# Patient Record
Sex: Female | Born: 1935 | Race: White | Hispanic: No | Smoking: Never smoker
Health system: Southern US, Community
[De-identification: ages and names within clinical notes are randomized; demographics above are authoritative.]

## PROBLEM LIST (undated history)

## (undated) DIAGNOSIS — N289 Disorder of kidney and ureter, unspecified: Secondary | ICD-10-CM

## (undated) DIAGNOSIS — E559 Vitamin D deficiency, unspecified: Secondary | ICD-10-CM

## (undated) DIAGNOSIS — E785 Hyperlipidemia, unspecified: Secondary | ICD-10-CM

## (undated) DIAGNOSIS — I10 Essential (primary) hypertension: Secondary | ICD-10-CM

## (undated) DIAGNOSIS — K219 Gastro-esophageal reflux disease without esophagitis: Secondary | ICD-10-CM

## (undated) DIAGNOSIS — H409 Unspecified glaucoma: Secondary | ICD-10-CM

## (undated) DIAGNOSIS — J45909 Unspecified asthma, uncomplicated: Secondary | ICD-10-CM

## (undated) HISTORY — DX: Gastro-esophageal reflux disease without esophagitis: K21.9

## (undated) HISTORY — PX: THYROID SURGERY: SHX805

## (undated) HISTORY — DX: Hyperlipidemia, unspecified: E78.5

## (undated) HISTORY — DX: Vitamin D deficiency, unspecified: E55.9

## (undated) HISTORY — PX: HIP SURGERY: SHX245

## (undated) HISTORY — DX: Disorder of kidney and ureter, unspecified: N28.9

## (undated) HISTORY — DX: Essential (primary) hypertension: I10

## (undated) HISTORY — PX: CATARACT EXTRACTION, BILATERAL: SHX1313

---

## 1995-12-09 HISTORY — PX: BREAST BIOPSY: SHX20

## 1999-09-30 ENCOUNTER — Encounter: Payer: Self-pay | Admitting: Urology

## 1999-09-30 ENCOUNTER — Encounter: Admission: RE | Admit: 1999-09-30 | Discharge: 1999-09-30 | Payer: Self-pay | Admitting: Urology

## 1999-10-16 ENCOUNTER — Encounter: Payer: Self-pay | Admitting: Urology

## 1999-10-16 ENCOUNTER — Ambulatory Visit (HOSPITAL_COMMUNITY): Admission: RE | Admit: 1999-10-16 | Discharge: 1999-10-16 | Payer: Self-pay | Admitting: Urology

## 2000-02-04 ENCOUNTER — Encounter: Payer: Self-pay | Admitting: Internal Medicine

## 2000-02-04 ENCOUNTER — Ambulatory Visit (HOSPITAL_COMMUNITY): Admission: RE | Admit: 2000-02-04 | Discharge: 2000-02-04 | Payer: Self-pay | Admitting: Internal Medicine

## 2000-06-15 ENCOUNTER — Ambulatory Visit (HOSPITAL_COMMUNITY): Admission: RE | Admit: 2000-06-15 | Discharge: 2000-06-15 | Payer: Self-pay | Admitting: Urology

## 2000-06-15 ENCOUNTER — Encounter: Payer: Self-pay | Admitting: Urology

## 2000-07-07 ENCOUNTER — Encounter: Payer: Self-pay | Admitting: Urology

## 2000-07-09 ENCOUNTER — Inpatient Hospital Stay (HOSPITAL_COMMUNITY): Admission: RE | Admit: 2000-07-09 | Discharge: 2000-07-12 | Payer: Self-pay | Admitting: Urology

## 2000-07-09 ENCOUNTER — Encounter (INDEPENDENT_AMBULATORY_CARE_PROVIDER_SITE_OTHER): Payer: Self-pay

## 2000-07-09 ENCOUNTER — Encounter: Payer: Self-pay | Admitting: Oral & Maxillofacial Surgery

## 2001-02-11 ENCOUNTER — Encounter: Payer: Self-pay | Admitting: Internal Medicine

## 2001-02-11 ENCOUNTER — Ambulatory Visit (HOSPITAL_COMMUNITY): Admission: RE | Admit: 2001-02-11 | Discharge: 2001-02-11 | Payer: Self-pay | Admitting: Internal Medicine

## 2001-04-29 ENCOUNTER — Encounter: Payer: Self-pay | Admitting: Urology

## 2001-04-29 ENCOUNTER — Ambulatory Visit (HOSPITAL_COMMUNITY): Admission: RE | Admit: 2001-04-29 | Discharge: 2001-04-29 | Payer: Self-pay | Admitting: Urology

## 2001-05-05 ENCOUNTER — Encounter: Payer: Self-pay | Admitting: Urology

## 2001-05-05 ENCOUNTER — Ambulatory Visit (HOSPITAL_COMMUNITY): Admission: RE | Admit: 2001-05-05 | Discharge: 2001-05-05 | Payer: Self-pay | Admitting: Urology

## 2001-05-19 ENCOUNTER — Encounter: Payer: Self-pay | Admitting: Urology

## 2001-05-20 ENCOUNTER — Ambulatory Visit (HOSPITAL_COMMUNITY): Admission: RE | Admit: 2001-05-20 | Discharge: 2001-05-20 | Payer: Self-pay | Admitting: Urology

## 2001-05-27 ENCOUNTER — Encounter: Payer: Self-pay | Admitting: Urology

## 2001-05-27 ENCOUNTER — Ambulatory Visit (HOSPITAL_COMMUNITY): Admission: RE | Admit: 2001-05-27 | Discharge: 2001-05-27 | Payer: Self-pay | Admitting: Urology

## 2001-06-11 ENCOUNTER — Ambulatory Visit (HOSPITAL_COMMUNITY): Admission: RE | Admit: 2001-06-11 | Discharge: 2001-06-11 | Payer: Self-pay | Admitting: Urology

## 2001-07-22 ENCOUNTER — Encounter: Admission: RE | Admit: 2001-07-22 | Discharge: 2001-07-22 | Payer: Self-pay | Admitting: Urology

## 2001-07-22 ENCOUNTER — Encounter: Payer: Self-pay | Admitting: Urology

## 2001-07-27 ENCOUNTER — Encounter: Payer: Self-pay | Admitting: Internal Medicine

## 2001-07-27 ENCOUNTER — Ambulatory Visit (HOSPITAL_COMMUNITY): Admission: RE | Admit: 2001-07-27 | Discharge: 2001-07-27 | Payer: Self-pay | Admitting: Internal Medicine

## 2001-10-13 ENCOUNTER — Ambulatory Visit (HOSPITAL_COMMUNITY): Admission: RE | Admit: 2001-10-13 | Discharge: 2001-10-13 | Payer: Self-pay | Admitting: Urology

## 2001-10-13 ENCOUNTER — Encounter: Payer: Self-pay | Admitting: Urology

## 2001-10-22 ENCOUNTER — Ambulatory Visit (HOSPITAL_BASED_OUTPATIENT_CLINIC_OR_DEPARTMENT_OTHER): Admission: RE | Admit: 2001-10-22 | Discharge: 2001-10-22 | Payer: Self-pay | Admitting: Urology

## 2001-12-04 ENCOUNTER — Inpatient Hospital Stay (HOSPITAL_COMMUNITY): Admission: EM | Admit: 2001-12-04 | Discharge: 2001-12-16 | Payer: Self-pay | Admitting: Emergency Medicine

## 2001-12-04 ENCOUNTER — Encounter (INDEPENDENT_AMBULATORY_CARE_PROVIDER_SITE_OTHER): Payer: Self-pay | Admitting: Specialist

## 2001-12-07 ENCOUNTER — Encounter: Payer: Self-pay | Admitting: Urology

## 2001-12-08 HISTORY — PX: COLONOSCOPY: SHX174

## 2001-12-08 HISTORY — PX: NEPHRECTOMY: SHX65

## 2001-12-12 ENCOUNTER — Encounter: Payer: Self-pay | Admitting: Urology

## 2002-07-26 ENCOUNTER — Encounter: Payer: Self-pay | Admitting: Internal Medicine

## 2002-07-26 ENCOUNTER — Ambulatory Visit (HOSPITAL_COMMUNITY): Admission: RE | Admit: 2002-07-26 | Discharge: 2002-07-26 | Payer: Self-pay | Admitting: Internal Medicine

## 2002-07-28 ENCOUNTER — Encounter: Admission: RE | Admit: 2002-07-28 | Discharge: 2002-07-28 | Payer: Self-pay | Admitting: Internal Medicine

## 2002-07-28 ENCOUNTER — Encounter: Payer: Self-pay | Admitting: Internal Medicine

## 2002-10-27 ENCOUNTER — Ambulatory Visit (HOSPITAL_COMMUNITY): Admission: RE | Admit: 2002-10-27 | Discharge: 2002-10-27 | Payer: Self-pay | Admitting: Gastroenterology

## 2003-07-02 ENCOUNTER — Emergency Department (HOSPITAL_COMMUNITY): Admission: EM | Admit: 2003-07-02 | Discharge: 2003-07-02 | Payer: Self-pay | Admitting: Emergency Medicine

## 2003-07-02 ENCOUNTER — Encounter: Payer: Self-pay | Admitting: Emergency Medicine

## 2003-08-24 ENCOUNTER — Ambulatory Visit (HOSPITAL_COMMUNITY): Admission: RE | Admit: 2003-08-24 | Discharge: 2003-08-24 | Payer: Self-pay | Admitting: Internal Medicine

## 2003-08-24 ENCOUNTER — Encounter: Payer: Self-pay | Admitting: Internal Medicine

## 2004-03-22 ENCOUNTER — Encounter: Admission: RE | Admit: 2004-03-22 | Discharge: 2004-03-22 | Payer: Self-pay | Admitting: Internal Medicine

## 2005-10-21 ENCOUNTER — Encounter: Admission: RE | Admit: 2005-10-21 | Discharge: 2005-10-21 | Payer: Self-pay | Admitting: Internal Medicine

## 2005-12-04 ENCOUNTER — Ambulatory Visit (HOSPITAL_COMMUNITY): Admission: RE | Admit: 2005-12-04 | Discharge: 2005-12-04 | Payer: Self-pay | Admitting: Internal Medicine

## 2006-07-30 ENCOUNTER — Encounter: Admission: RE | Admit: 2006-07-30 | Discharge: 2006-07-30 | Payer: Self-pay | Admitting: Internal Medicine

## 2006-10-23 ENCOUNTER — Encounter: Admission: RE | Admit: 2006-10-23 | Discharge: 2006-10-23 | Payer: Self-pay | Admitting: Internal Medicine

## 2007-05-13 ENCOUNTER — Encounter: Admission: RE | Admit: 2007-05-13 | Discharge: 2007-05-13 | Payer: Self-pay | Admitting: Gastroenterology

## 2007-05-25 ENCOUNTER — Ambulatory Visit (HOSPITAL_COMMUNITY): Admission: RE | Admit: 2007-05-25 | Discharge: 2007-05-25 | Payer: Self-pay | Admitting: Gastroenterology

## 2007-11-25 ENCOUNTER — Encounter: Admission: RE | Admit: 2007-11-25 | Discharge: 2007-11-25 | Payer: Self-pay | Admitting: Orthopedic Surgery

## 2007-12-09 HISTORY — PX: ESOPHAGOGASTRODUODENOSCOPY: SHX1529

## 2007-12-16 ENCOUNTER — Encounter: Admission: RE | Admit: 2007-12-16 | Discharge: 2007-12-16 | Payer: Self-pay | Admitting: Orthopedic Surgery

## 2008-02-10 ENCOUNTER — Ambulatory Visit (HOSPITAL_COMMUNITY): Admission: RE | Admit: 2008-02-10 | Discharge: 2008-02-10 | Payer: Self-pay

## 2008-03-31 ENCOUNTER — Encounter: Admission: RE | Admit: 2008-03-31 | Discharge: 2008-03-31 | Payer: Self-pay | Admitting: Internal Medicine

## 2008-10-17 ENCOUNTER — Ambulatory Visit (HOSPITAL_COMMUNITY): Admission: RE | Admit: 2008-10-17 | Discharge: 2008-10-17 | Payer: Self-pay | Admitting: Gastroenterology

## 2009-10-02 ENCOUNTER — Emergency Department (HOSPITAL_COMMUNITY): Admission: EM | Admit: 2009-10-02 | Discharge: 2009-10-02 | Payer: Self-pay | Admitting: Emergency Medicine

## 2010-02-01 ENCOUNTER — Emergency Department (HOSPITAL_COMMUNITY): Admission: EM | Admit: 2010-02-01 | Discharge: 2010-02-01 | Payer: Self-pay | Admitting: Emergency Medicine

## 2010-03-26 ENCOUNTER — Ambulatory Visit (HOSPITAL_COMMUNITY): Admission: RE | Admit: 2010-03-26 | Discharge: 2010-03-26 | Payer: Self-pay | Admitting: Internal Medicine

## 2010-04-02 ENCOUNTER — Encounter: Admission: RE | Admit: 2010-04-02 | Discharge: 2010-04-02 | Payer: Self-pay | Admitting: Internal Medicine

## 2010-07-10 ENCOUNTER — Encounter: Admission: RE | Admit: 2010-07-10 | Discharge: 2010-07-10 | Payer: Self-pay | Admitting: Orthopedic Surgery

## 2010-11-29 ENCOUNTER — Emergency Department (HOSPITAL_COMMUNITY)
Admission: EM | Admit: 2010-11-29 | Discharge: 2010-11-29 | Payer: Self-pay | Source: Home / Self Care | Admitting: Emergency Medicine

## 2010-12-29 ENCOUNTER — Encounter: Payer: Self-pay | Admitting: Internal Medicine

## 2011-02-17 LAB — URINALYSIS, ROUTINE W REFLEX MICROSCOPIC
Bilirubin Urine: NEGATIVE
Glucose, UA: NEGATIVE mg/dL
Leukocytes, UA: NEGATIVE
Nitrite: NEGATIVE
Protein, ur: NEGATIVE mg/dL
Specific Gravity, Urine: 1.005 (ref 1.005–1.030)
Urobilinogen, UA: 0.2 mg/dL (ref 0.0–1.0)

## 2011-02-17 LAB — CBC
HCT: 38.2 % (ref 36.0–46.0)
MCHC: 32.2 g/dL (ref 30.0–36.0)
MCV: 93.2 fL (ref 78.0–100.0)
RBC: 4.1 MIL/uL (ref 3.87–5.11)

## 2011-02-17 LAB — DIFFERENTIAL
Basophils Relative: 1 % (ref 0–1)
Lymphocytes Relative: 37 % (ref 12–46)
Monocytes Absolute: 0.6 10*3/uL (ref 0.1–1.0)
Monocytes Relative: 9 % (ref 3–12)
Neutro Abs: 3.2 10*3/uL (ref 1.7–7.7)

## 2011-02-17 LAB — BASIC METABOLIC PANEL
CO2: 25 mEq/L (ref 19–32)
Calcium: 9.5 mg/dL (ref 8.4–10.5)
GFR calc Af Amer: 37 mL/min — ABNORMAL LOW (ref >60)
GFR calc non Af Amer: 30 mL/min — ABNORMAL LOW (ref >60)
Sodium: 141 mEq/L (ref 135–145)

## 2011-02-17 LAB — URINE MICROSCOPIC-ADD ON

## 2011-02-17 LAB — POCT CARDIAC MARKERS: Troponin i, poc: <0.05 ng/mL (ref 0.00–0.09)

## 2011-02-26 LAB — DIFFERENTIAL
Basophils Relative: 1 % (ref 0–1)
Lymphocytes Relative: 13 % (ref 12–46)
Lymphs Abs: 1.3 10*3/uL (ref 0.7–4.0)
Monocytes Relative: 3 % (ref 3–12)
Neutrophils Relative %: 83 % — ABNORMAL HIGH (ref 43–77)

## 2011-02-26 LAB — POCT CARDIAC MARKERS
CKMB, poc: <1 ng/mL — ABNORMAL LOW (ref 1.0–8.0)
Myoglobin, poc: 98.5 ng/mL (ref 12–200)
Troponin i, poc: <0.05 ng/mL (ref 0.00–0.09)
Troponin i, poc: <0.05 ng/mL (ref 0.00–0.09)

## 2011-02-26 LAB — CBC
Hemoglobin: 12 g/dL (ref 12.0–15.0)
Platelets: 187 10*3/uL (ref 150–400)
RBC: 3.87 MIL/uL (ref 3.87–5.11)
WBC: 10.5 10*3/uL (ref 4.0–10.5)

## 2011-02-26 LAB — POCT I-STAT, CHEM 8
BUN: 34 mg/dL — ABNORMAL HIGH (ref 6–23)
Chloride: 107 mEq/L (ref 96–112)
HCT: 36 % (ref 36.0–46.0)
Hemoglobin: 12.2 g/dL (ref 12.0–15.0)
Potassium: 4.3 mEq/L (ref 3.5–5.1)

## 2011-03-03 ENCOUNTER — Other Ambulatory Visit: Payer: Self-pay | Admitting: Internal Medicine

## 2011-03-03 DIAGNOSIS — Z1231 Encounter for screening mammogram for malignant neoplasm of breast: Secondary | ICD-10-CM

## 2011-03-07 ENCOUNTER — Ambulatory Visit
Admission: RE | Admit: 2011-03-07 | Discharge: 2011-03-07 | Disposition: A | Discharge status: Home / Self Care | Payer: Medicaid Other | Source: Ambulatory Visit | Attending: Internal Medicine | Admitting: Internal Medicine

## 2011-03-07 DIAGNOSIS — Z1231 Encounter for screening mammogram for malignant neoplasm of breast: Secondary | ICD-10-CM

## 2011-04-12 ENCOUNTER — Emergency Department (HOSPITAL_COMMUNITY): Payer: Medicare Other

## 2011-04-12 ENCOUNTER — Inpatient Hospital Stay (HOSPITAL_COMMUNITY)
Admission: EM | Admit: 2011-04-12 | Discharge: 2011-04-17 | DRG: 481 | Disposition: A | Discharge status: Skilled Nursing Facility | Payer: Medicare Other | Attending: Orthopedic Surgery | Admitting: Orthopedic Surgery

## 2011-04-12 DIAGNOSIS — K219 Gastro-esophageal reflux disease without esophagitis: Secondary | ICD-10-CM | POA: Diagnosis present

## 2011-04-12 DIAGNOSIS — Z905 Acquired absence of kidney: Secondary | ICD-10-CM

## 2011-04-12 DIAGNOSIS — E890 Postprocedural hypothyroidism: Secondary | ICD-10-CM | POA: Diagnosis present

## 2011-04-12 DIAGNOSIS — N179 Acute kidney failure, unspecified: Secondary | ICD-10-CM | POA: Diagnosis not present

## 2011-04-12 DIAGNOSIS — D72829 Elevated white blood cell count, unspecified: Secondary | ICD-10-CM | POA: Diagnosis present

## 2011-04-12 DIAGNOSIS — F411 Generalized anxiety disorder: Secondary | ICD-10-CM | POA: Diagnosis present

## 2011-04-12 DIAGNOSIS — Y92009 Unspecified place in unspecified non-institutional (private) residence as the place of occurrence of the external cause: Secondary | ICD-10-CM

## 2011-04-12 DIAGNOSIS — D62 Acute posthemorrhagic anemia: Secondary | ICD-10-CM | POA: Diagnosis not present

## 2011-04-12 DIAGNOSIS — J449 Chronic obstructive pulmonary disease, unspecified: Secondary | ICD-10-CM | POA: Diagnosis present

## 2011-04-12 DIAGNOSIS — J9819 Other pulmonary collapse: Secondary | ICD-10-CM | POA: Diagnosis not present

## 2011-04-12 DIAGNOSIS — M549 Dorsalgia, unspecified: Secondary | ICD-10-CM | POA: Diagnosis present

## 2011-04-12 DIAGNOSIS — W010XXA Fall on same level from slipping, tripping and stumbling without subsequent striking against object, initial encounter: Secondary | ICD-10-CM | POA: Diagnosis present

## 2011-04-12 DIAGNOSIS — J4489 Other specified chronic obstructive pulmonary disease: Secondary | ICD-10-CM | POA: Diagnosis present

## 2011-04-12 DIAGNOSIS — G8929 Other chronic pain: Secondary | ICD-10-CM | POA: Diagnosis present

## 2011-04-12 DIAGNOSIS — S72033A Displaced midcervical fracture of unspecified femur, initial encounter for closed fracture: Principal | ICD-10-CM | POA: Diagnosis present

## 2011-04-12 DIAGNOSIS — I10 Essential (primary) hypertension: Secondary | ICD-10-CM | POA: Diagnosis present

## 2011-04-12 LAB — BASIC METABOLIC PANEL
CO2: 25 mEq/L (ref 19–32)
Chloride: 96 mEq/L (ref 96–112)
GFR calc Af Amer: 34 mL/min — ABNORMAL LOW (ref >60)
Potassium: 3.6 mEq/L (ref 3.5–5.1)
Sodium: 131 mEq/L — ABNORMAL LOW (ref 135–145)

## 2011-04-12 LAB — DIFFERENTIAL
Basophils Absolute: 0 10*3/uL (ref 0.0–0.1)
Basophils Relative: 0 % (ref 0–1)
Lymphocytes Relative: 21 % (ref 12–46)
Lymphs Abs: 1.8 10*3/uL (ref 0.7–4.0)

## 2011-04-12 LAB — CBC
Hemoglobin: 11.7 g/dL — ABNORMAL LOW (ref 12.0–15.0)
RBC: 3.91 MIL/uL (ref 3.87–5.11)

## 2011-04-13 LAB — CBC
HCT: 33.5 % — ABNORMAL LOW (ref 36.0–46.0)
Hemoglobin: 11.1 g/dL — ABNORMAL LOW (ref 12.0–15.0)
MCH: 29.3 pg (ref 26.0–34.0)
MCV: 88.4 fL (ref 78.0–100.0)
RBC: 3.79 MIL/uL — ABNORMAL LOW (ref 3.87–5.11)

## 2011-04-13 LAB — COMPREHENSIVE METABOLIC PANEL
AST: 79 U/L — ABNORMAL HIGH (ref 0–37)
BUN: 20 mg/dL (ref 6–23)
CO2: 25 mEq/L (ref 19–32)
Chloride: 105 mEq/L (ref 96–112)
Creatinine, Ser: 1.53 mg/dL — ABNORMAL HIGH (ref 0.4–1.2)
GFR calc Af Amer: 38 mL/min — ABNORMAL LOW (ref >60)
GFR calc non Af Amer: 32 mL/min — ABNORMAL LOW (ref >60)
Total Bilirubin: 0.7 mg/dL (ref 0.3–1.2)

## 2011-04-13 NOTE — Consult Note (Signed)
Kelly Li, Kelly Li                 ACCOUNT NO.:  [id number]  MEDICAL RECORD NO.:  [id number]           PATIENT TYPE:  I  LOCATION:  1612                         FACILITY:  [facility]  PHYSICIAN:  Lonia Blood, M.D.      DATE OF BIRTH:  1917-01-29  DATE OF CONSULTATION:  04/13/2011 DATE OF DISCHARGE:                                CONSULTATION   REQUESTING PHYSICIAN:  Ollen Gross, M.D., Orthopedics  REASON FOR CONSULTATION:  Medical management of asthma, hypothyroidism, and preoperative clearance.  BRIEF HISTORY OF PRESENT ILLNESS:  The patient is a 77 year old female who has been admitted to the hospital secondary to mechanical fall resulting in right intertrochanteric hip fracture.  She was apparently remarkably doing well.  She was driving around, walking without support, and was actually picking flowers today in her yard without any major problem.  She has no prior fracture except for her toe for which she had seen Dr. Alveda Reasons.  The patient was turning when she slipped, lost her balance and fell, and she sustained a right intertrochanteric fracture, came to the ED.  She is currently stable after pain control.  She is awake, alert, oriented without any other injuries.  The patient is being evaluated for possible surgical repair of her fractures.  PAST MEDICAL HISTORY:  Significant for seasonal allergies, asthma, chronic back pain, GERD, hypothyroidism, anxiety disorder, and occasional migraine headaches.  ALLERGIES: 1. CODEINE. 2. MORPHINE.  CURRENT MEDICATIONS:  Advair Diskus, Claritin, Fioricet, levothyroxine, lisinopril, Prevacid, temazepam, Vicodin, and Xanax.  SOCIAL HISTORY:  The patient lives alone at home in [place].  She has 2 grown-up children who are around and available to assist her.  No tobacco, alcohol, or IV drug use.  She is very much able to do all her ADLs without much support.  FAMILY HISTORY:  Noncontributory.  REVIEW OF SYSTEMS:  All systems  reviewed are negative except per HPI.  PHYSICAL EXAMINATION:  VITAL SIGNS:  Temperature 99.9, blood pressure 164/57, pulse 63, respiratory rate 15, sats 96% on room air. GENERAL:  She is a very pleasant 77 year old female, awake, alert, oriented, communicating.  She is in no acute distress. HEENT:  PERRL.  EOMI.  No pallor noted.  There is no rhinorrhea. NECK:  Supple.  No JVD, no lymphadenopathy. RESPIRATORY:  She has good air entry bilaterally.  No wheezes, no rales. CARDIOVASCULAR SYSTEM:  She has S1, S2.  No murmur. ABDOMEN:  Soft, full, nontender with positive bowel sounds. EXTREMITIES:  Right lower extremity is shortened and laterally rotated. It is tender at the lateral aspect of her hip and angulated.  No ecchymosis seen.  No other joints involving the same. SKIN EXAM:  No rashes, no bruises, no ulcers. NEURO EXAM:  Cranial nerves II through XII seem to be intact.  No focal neurologic deficit.  LABORATORY DATA:  White count 8.9, hemoglobin 11.7 with MCV of 87.  Her platelet count is 204.  Sodium 131, potassium 3.6, chloride 96, CO2 of 25, glucose 122, BUN is 25, creatinine 1.69, calcium 9.0.  Right ankle x- ray showed no acute findings with some osteopenia.  Right  hip 2-view showed impacted subcapital right femoral neck fracture and some osteopenia.  Chest x-ray shows no active disease.  CT head without contrast showed no evidence of intracranial hemorrhage and no major problems observed.  There is mild cerebral atrophy and chronic small vessel ischemic changes.  ASSESSMENT:  This is a 77 year old female with the following problems: 1. Right femoral neck fracture. 2. Mild normocytic anemia. 3. Hyponatremia. 4. Probably acute renal insufficiency. 5. Asthma. 6. Hypothyroidism. 7. Gastroesophageal reflux disease. 8. Chronic back pain. 9. Seasonal allergies.  RECOMMENDATIONS: 1. The patient seems to be stable and from medical point of view     cleared for surgical  reduction.  She is pretty active with     activities of daily living and has full mental capacity, so she     should undergo surgical reduction at this point. 2. Continue with home medications. 3. We will check EKG, but she has no cardiac disease, at least to the     best of our knowledge.  She will not necessarily need an echo at     this point. 4. We will follow her CBC and electrolytes while in the hospital. 5. DVT prophylaxis is required starting with some Lovenox pre and     postoperatively.  Thank you for the consult, and we will follow with you.     Lonia Blood, M.D.     Kelly Li  D:  04/13/2011  T:  04/13/2011  Job:  [id number]  Electronically Signed by Lonia Blood M.D. on 04/13/2011 10:12:31 PM

## 2011-04-14 ENCOUNTER — Inpatient Hospital Stay (HOSPITAL_COMMUNITY): Payer: Medicare Other

## 2011-04-14 LAB — CBC
MCH: 29.8 pg (ref 26.0–34.0)
Platelets: 146 10*3/uL — ABNORMAL LOW (ref 150–400)
RBC: 3.79 MIL/uL — ABNORMAL LOW (ref 3.87–5.11)

## 2011-04-14 LAB — BASIC METABOLIC PANEL
Calcium: 8.3 mg/dL — ABNORMAL LOW (ref 8.4–10.5)
Chloride: 103 mEq/L (ref 96–112)
Creatinine, Ser: 1.22 mg/dL — ABNORMAL HIGH (ref 0.4–1.2)
GFR calc Af Amer: 50 mL/min — ABNORMAL LOW (ref >60)
GFR calc non Af Amer: 41 mL/min — ABNORMAL LOW (ref >60)

## 2011-04-14 IMAGING — CT CT PELVIS W/O CM
2 of 3 series · 17 of 46 positions shown, 19 images · non-contrast
Comparison: CT abdomen pelvis 10/02/2009

CLINICAL DATA: Low back pain.  Right buttock and flank pain.

CT PELVIS WITHOUT CONTRAST
TECHNIQUE: Multidetector CT imaging of the pelvis was performed
following the standard protocol without intravenous contrast.

[Series 3: pelvis standard · axial · 0.66mm/px · z∈[-202,-2]mm · 14 of 92 slices shown, 16 images]
[im 6/92  soft-tissue]
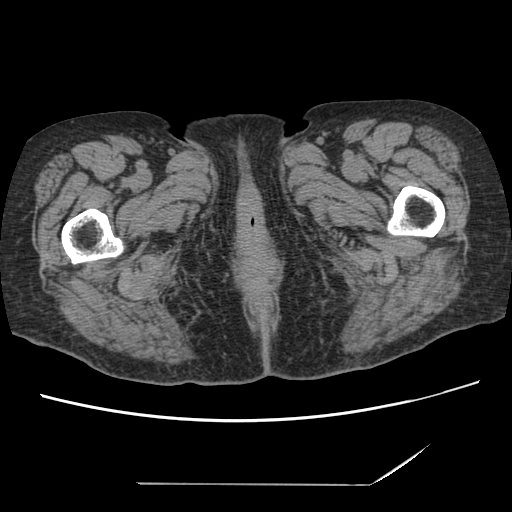
[im 6/92  bone]
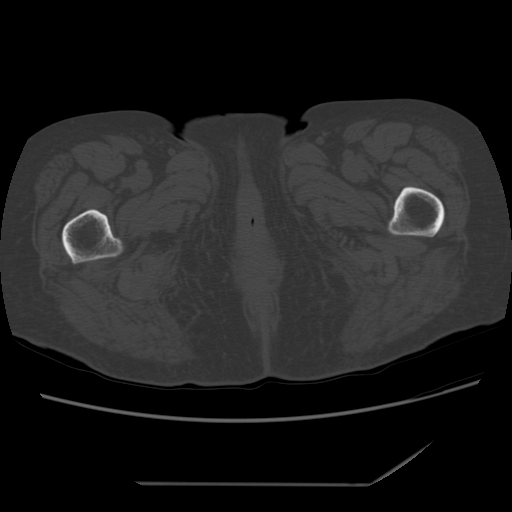
[im 12/92  soft-tissue]
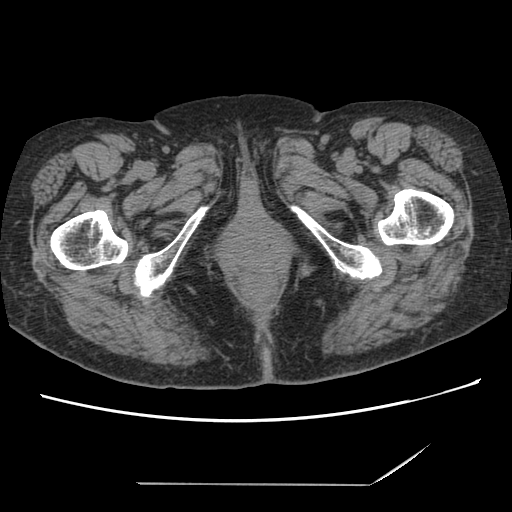
[im 18/92  soft-tissue]
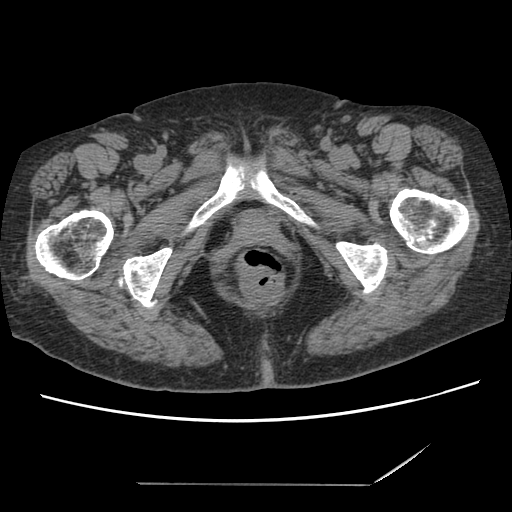
[im 24/92  soft-tissue]
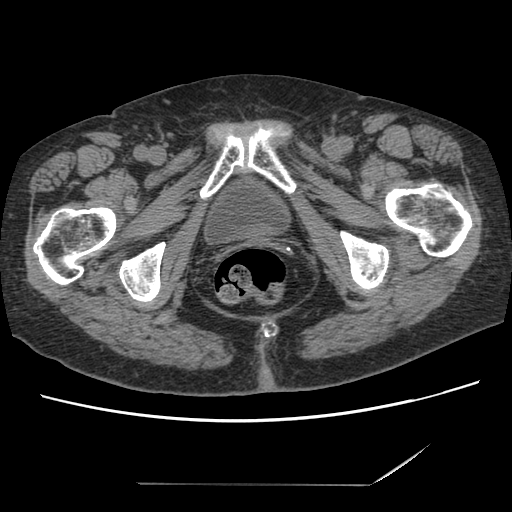
[im 30/92  soft-tissue]
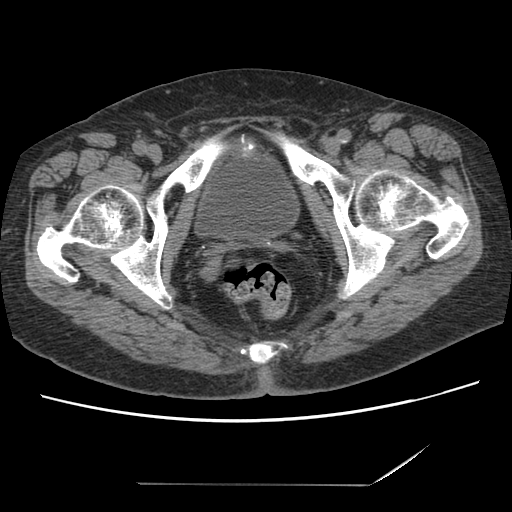
[im 36/92  soft-tissue]
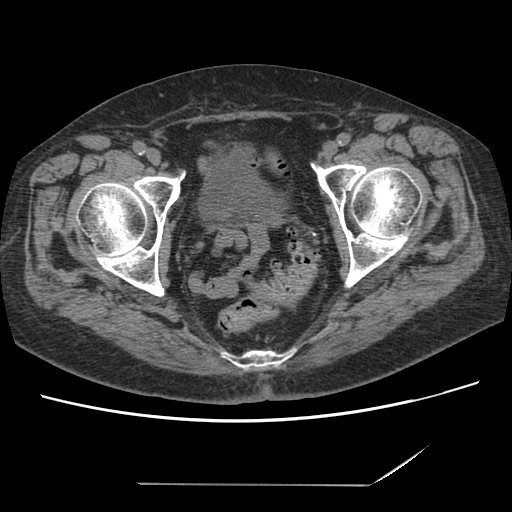
[im 42/92  soft-tissue]
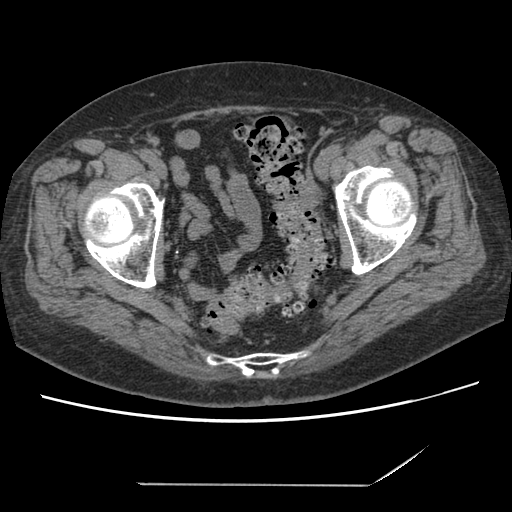
[im 50/92  soft-tissue]
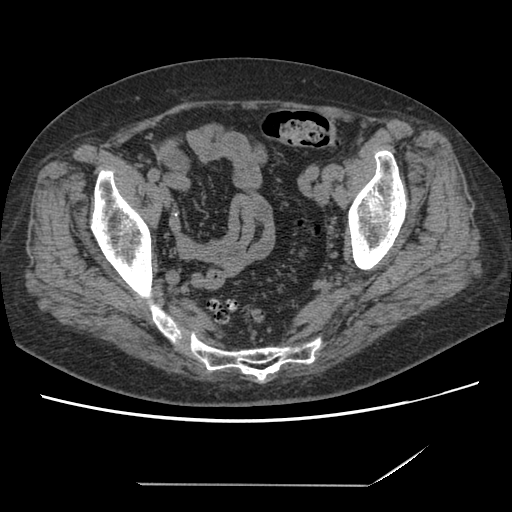
[im 56/92  soft-tissue]
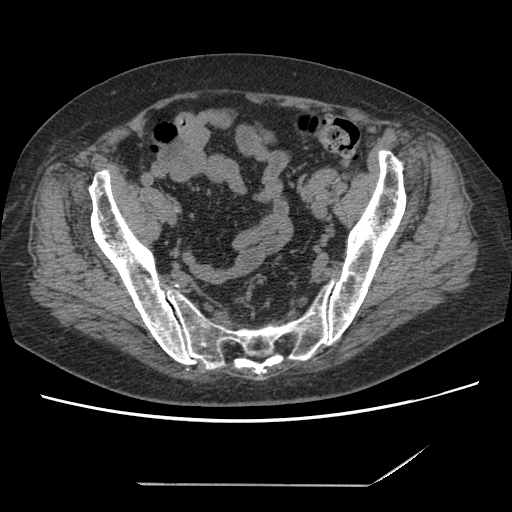
[im 56/92  bone]
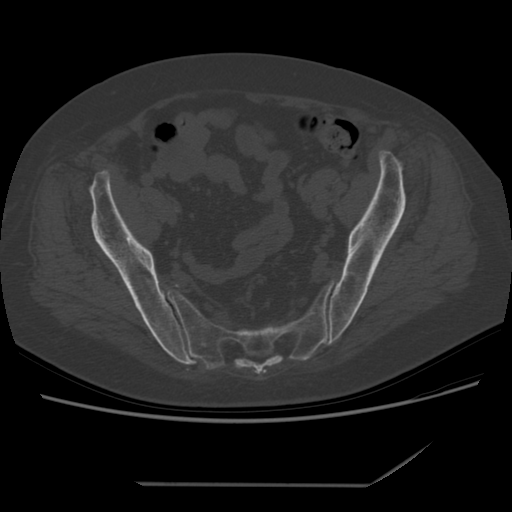
[im 62/92  soft-tissue]
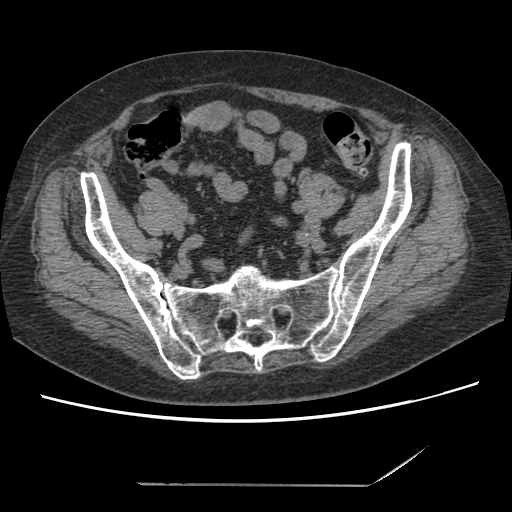
[im 68/92  soft-tissue]
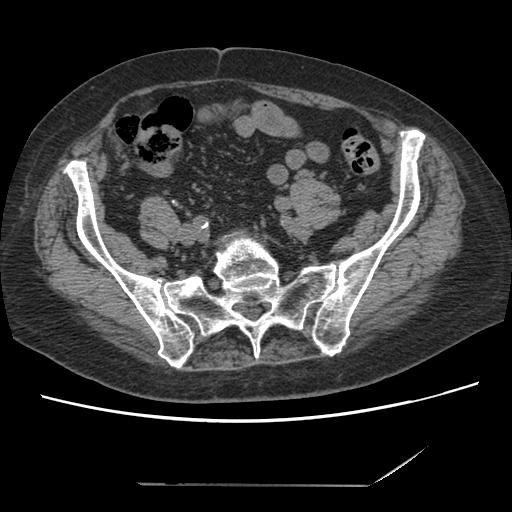
[im 74/92  soft-tissue]
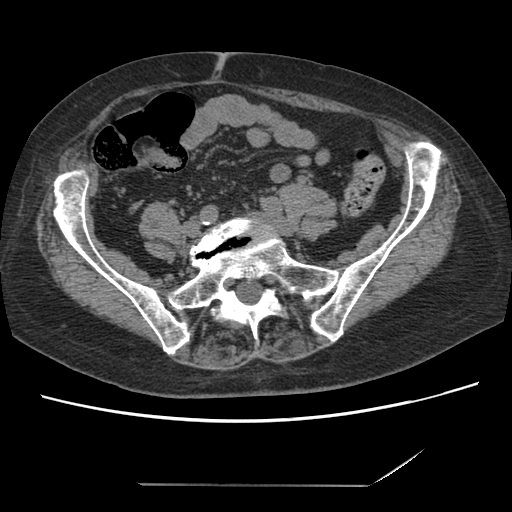
[im 80/92  soft-tissue]
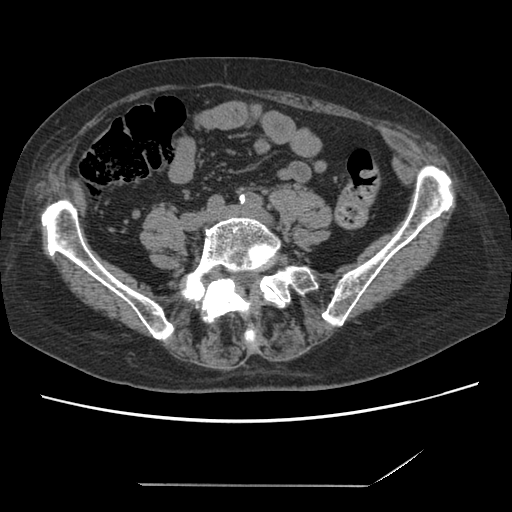
[im 86/92  soft-tissue]
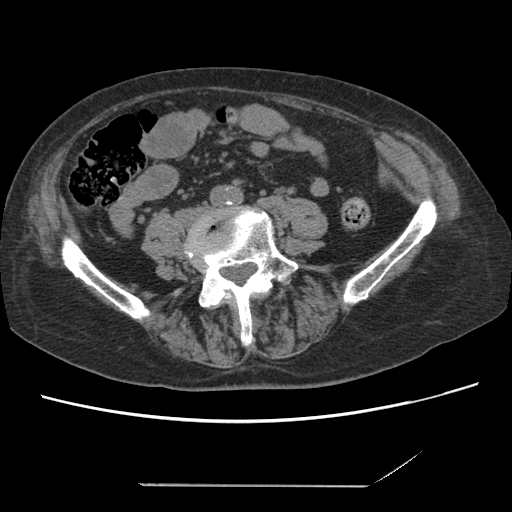

[Series 400: cor · coronal · 0.66mm/px · 3 of 103 slices shown]
[im 35/103  soft-tissue]
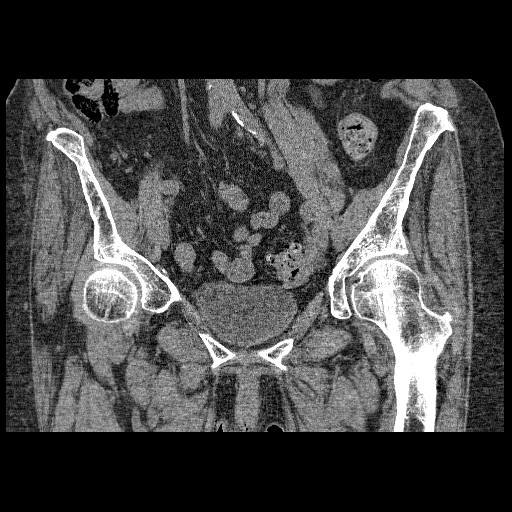
[im 46/103  soft-tissue]
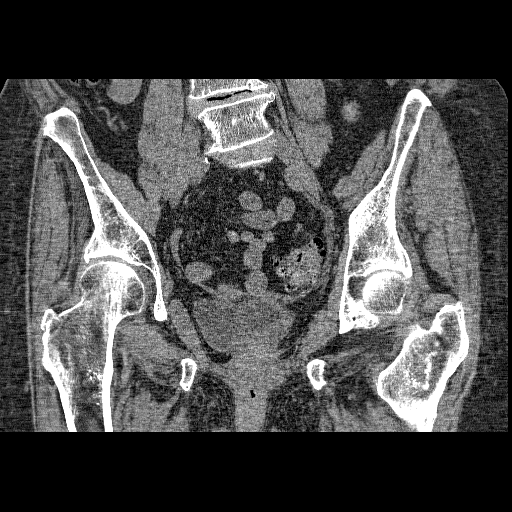
[im 57/103  soft-tissue]
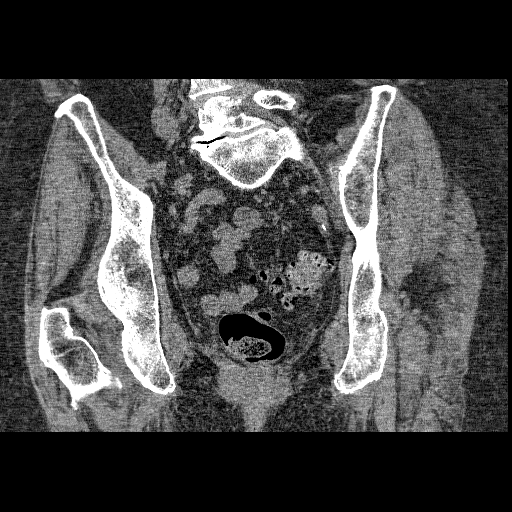

[17 of 46 positions shown; findings below may reference images not displayed]

FINDINGS: Osteopenia.  Advanced facet hypertrophy is seen on the
right at L5-S1.  There is right lateral loss of disc space height
at this level, with vacuum disc phenomenon.  Vacuum disc phenomenon
is also seen at L4-5.  No acute fracture.  Hip joint space is
maintained bilaterally.

There may be fat in the the dome of the bladder, which can be a
normal finding.  Extensive colonic diverticulosis.  Hysterectomy.
Ovaries are visualized. No pathologically enlarged lymph nodes.  No
free fluid.
IMPRESSION: Spondylosis, with asymmetric involvement on the right at L5-S1.

## 2011-04-15 ENCOUNTER — Inpatient Hospital Stay (HOSPITAL_COMMUNITY): Payer: Medicare Other

## 2011-04-15 LAB — CBC
MCV: 88.9 fL (ref 78.0–100.0)
Platelets: 141 10*3/uL — ABNORMAL LOW (ref 150–400)
RBC: 3.7 MIL/uL — ABNORMAL LOW (ref 3.87–5.11)
WBC: 10.9 10*3/uL — ABNORMAL HIGH (ref 4.0–10.5)

## 2011-04-15 LAB — BASIC METABOLIC PANEL
Chloride: 103 mEq/L (ref 96–112)
GFR calc Af Amer: 50 mL/min — ABNORMAL LOW (ref >60)
Potassium: 4 mEq/L (ref 3.5–5.1)
Sodium: 135 mEq/L (ref 135–145)

## 2011-04-16 LAB — CBC
HCT: 28.9 % — ABNORMAL LOW (ref 36.0–46.0)
MCH: 29 pg (ref 26.0–34.0)
MCV: 88.1 fL (ref 78.0–100.0)
Platelets: 145 10*3/uL — ABNORMAL LOW (ref 150–400)
RDW: 12.8 % (ref 11.5–15.5)

## 2011-04-16 LAB — FOLATE: Folate: 16.4 ng/mL

## 2011-04-16 LAB — URINALYSIS, ROUTINE W REFLEX MICROSCOPIC
Bilirubin Urine: NEGATIVE
Glucose, UA: NEGATIVE mg/dL
Hgb urine dipstick: NEGATIVE
Ketones, ur: NEGATIVE mg/dL
Protein, ur: NEGATIVE mg/dL
Specific Gravity, Urine: 1.012 (ref 1.005–1.030)

## 2011-04-16 LAB — VITAMIN B12: Vitamin B-12: 375 pg/mL (ref 211–911)

## 2011-04-16 LAB — BASIC METABOLIC PANEL
BUN: 12 mg/dL (ref 6–23)
Chloride: 106 mEq/L (ref 96–112)
Creatinine, Ser: 1.24 mg/dL — ABNORMAL HIGH (ref 0.4–1.2)
GFR calc non Af Amer: 41 mL/min — ABNORMAL LOW (ref >60)
Glucose, Bld: 109 mg/dL — ABNORMAL HIGH (ref 70–99)

## 2011-04-16 LAB — IRON AND TIBC: Iron: <10 ug/dL — ABNORMAL LOW (ref 42–135)

## 2011-04-16 NOTE — Op Note (Signed)
  [name], [name]                 ACCOUNT NO.:  [id number]  MEDICAL RECORD NO.:  [id number]           PATIENT TYPE:  I  LOCATION:  1612                         FACILITY:  [facility]  PHYSICIAN:  Ollen Gross, M.D.    DATE OF BIRTH:  [birth year]  DATE OF PROCEDURE:  04/14/2011 DATE OF DISCHARGE:                              OPERATIVE REPORT   PREOPERATIVE DIAGNOSIS:  Right femoral neck fracture.  POSTOPERATIVE DIAGNOSIS:  Right femoral neck fracture.  PROCEDURE:  Right hip pinning.  SURGEON:  Ollen Gross, MD  ASSISTANT:  None.  ANESTHESIA:  General.  ESTIMATED BLOOD LOSS:  Minimal.  DRAIN:  None.  COMPLICATIONS:  None.  CONDITION:  Stable to Recovery.  BRIEF CLINICAL NOTE:  [name] is a 77 year old female who had a fall on May 5th, sustaining an impacted right femoral neck fracture.  She has been cleared medically, presents now for right hip pinning.  PROCEDURE IN DETAIL:  After successful administration of general anesthetic, the patient was placed on the fracture table, right leg in the well-padded boot, left lower extremity well padded to leg holder. Traction was not applied across the hip.  X-ray confirmed that the fracture remained impacted.  This was on the AP and lateral planes.  It did not displace.  The thigh was then prepped and draped in usual sterile fashion.  Under fluoroscopic guidance, a guide pin was placed on top of site to gain appropriate angle and point of entry.  Small incision was then made laterally over the lateral femur.  This was cut through the skin through subcutaneous tissue to the fascia lata which was incised in line with the skin incision.  Guide pin for 6.5 mm Ace cannulated screw was then passed to enter the center of the femoral head on the AP slightly posterior on the lateral.  I placed 2 other pins inferior and posterior.  The length of each is 80 mm.  They are all passed over the guide pins and then tightened down to the lateral  cortex of the femur.  The screws were all in good position and then the guide pins were removed.  AP and lateral views were taken and saved.  The wound was then copiously irrigated with saline solution.  Fascia lata closed with interrupted 2-0 Vicryl, subcutaneous with interrupted 2-0 Vicryl, and subcuticular with running 4-0 Monocryl.  Incision was cleaned and dried and Steri-Strips and sterile dressing applied.  She was taken off the fracture table, placed back on the hospital bed, awakened and transported to Recovery in stable condition.     Ollen Gross, M.D.     FA/MEDQ  D:  04/14/2011  T:  04/15/2011  Job:  [id number]  Electronically Signed by Ollen Gross M.D. on 04/16/2011 09:54:59 AM

## 2011-04-21 LAB — CULTURE, BLOOD (SINGLE)
Culture  Setup Time: 201205080450
Culture  Setup Time: 201205080451
Culture: NO GROWTH

## 2011-04-22 NOTE — Op Note (Signed)
NAMERENEKA, Kelly Li                 ACCOUNT NO.:  [id number]   MEDICAL RECORD NO.:  [id number]          PATIENT TYPE:  AMB   LOCATION:  ENDO                         FACILITY:  [facility]   PHYSICIAN:  Petra Kuba, M.D.    DATE OF BIRTH:  08/04/1917   DATE OF PROCEDURE:  10/17/2008  DATE OF DISCHARGE:                               OPERATIVE REPORT   PROCEDURE:  Esophagogastroduodenoscopy with Savary dilatation.   INDICATIONS:  Patient with a history of esophageal ring.  Ran out of her  medicine a month ago with increasing dysphagia.   Consent was signed after risks, benefits, methods, options thoroughly  discussed multiple times in the past.   MEDICINES USED:  Fentanyl 50 mcg, Versed 5 mg.   PROCEDURE:  The video endoscope was inserted by direct vision.  The  esophagus was normal.  In the distal esophagus was a tiny hiatal hernia  and a thin fibrous ring.  The scope passed through with very minimal  resistance, but there was some trauma when we looked back at the  esophagus.  The scope passed through a normal stomach into the antrum,  pertinent for some very minimal antritis, advanced through the pylorus.  Again, there was a little bit of friability in the pylorus.  The scope  passed into the duodenal bulb, which was normal, and around the C loop  to a normal second portion of the duodenum.  The scope was withdrawn  back to the bulb, and a good look there ruled out abnormalities in all  locations.  The scope was withdrawn back to the stomach and retroflexed.  There was a little heme in the cardia.  The fundus, angularis, lesser  and greater curve were all normal on retroflexed visualization.  Straight visualization of the stomach did not reveal any additional  findings.  The scope was then slowly withdrawn back to 20 cm, again  confirming the normal esophagus.  There was minimal trauma to the ring  without any active bleeding.  The scope was readvanced to the customary  location in  the antrum and under fluoroscopic guidance and endoscopic  guidance, a Savary wire was advanced to customary, and J loop with the  wire was confirmed under fluoro and endoscopically.  The wire was kept  in the proper position under fluoro while the scope was removed, and  then in succession, the Savary 12.8 and 15-mm dilators were both  advanced and confirmed in the proper position in the stomach.  She did  not like the advancement of either dilator.  The 12.8 passed without  resistance or heme.  The 15 with very minimal resistance but without any  heme.  Once the 15 was confirmed in the stomach, the wire was withdrawn  back into the dilator.  Both were removed in tandem.  We elected to stop  the procedure at this junction.  Patient tolerated the procedure  adequately.  There was no obvious immediate complication.   ENDOSCOPIC DIAGNOSIS:  1. A tiny hiatal hernia with a thin fibrous ring with minimal scope  trauma on passing the scope.  2. Minimal pylorus scarring.  3. Minimal antritis.  4. Otherwise normal esophagogastroduodenoscopy therapy with Savary      dilatation under fluoroscopy to 15 mm.   PLAN:  Resume Prevacid.  Prescription was written.  Try not to run out.  See how dilation works.  Hold ibuprofen for 3 days.  Slowly advance  diet.  Follow up p.r.n. or in 6 weeks.           ______________________________  Petra Kuba, M.D.     MEM/MEDQ  D:  10/17/2008  T:  10/17/2008  Job:  [id number]   cc:   Lucky Cowboy, M.D.  Fax: [phone]

## 2011-04-22 NOTE — Op Note (Signed)
Kelly Li, Kelly Li                 ACCOUNT NO.:  [id number]   MEDICAL RECORD NO.:  [id number]          PATIENT TYPE:  AMB   LOCATION:  ENDO                         FACILITY:  [facility]   PHYSICIAN:  Petra Kuba, M.D.    DATE OF BIRTH:  March 22, 1917   DATE OF PROCEDURE:  05/25/2007  DATE OF DISCHARGE:                               OPERATIVE REPORT   PROCEDURE:  EGD with Savory dilatation.   ENDOSCOPIST:  Petra Kuba, MD.   INDICATION:  A patient with a history of a hiatal hernia ring with an  abnormal barium swallow and increasing dysphagia.  Consent was signed  after risks, benefits, methods, and options thoroughly discussed  multiple times in the past.   MEDICINES USED:  1. Fentanyl 50 mcg.  2. Versed 5 mg.   PROCEDURE:  The video endoscope was inserted by direct vision.  Her  esophagus was tortuous but normal.  In the distal esophagus was a tiny,  little, small hiatal hernia with a fairly widely patent ring, however,  in advancing the scope passed the ring, there was a minimal amount of  trauma.  The scope passed into the stomach and advanced through a normal  antrum and a normal pylorus into a normal duodenal bulb and around the  celiac to a normal second portion of the duodenum.  The scope was  withdrawn back to the bulb and a good look there ruled out abnormalities  in that location.  The scope was withdrawn back to the stomach and  retroflexed.  The angularis, cardia, fundus, lesser and greater curve  were all normal on retroflexed visualization except for the hiatal  hernia being confirmed in the cardia.  Straight visualization in the  stomach did not reveal any additional findings.  The scope was then  slowly withdrawn back to the upper esophageal sphincter again confirming  the normal esophagus.  The scope was reinserted to the antrum and the  [facility] wire was advanced.  The customary J-loop of the wire was  confirmed endoscopically and under Fluoro.  Under Fluoro  guidance, the  scope was removed, making sure to keep the wire in the proper location,  and in succession the Savory 14 and 15-mm dilators were advanced and  confirmed in the proper position in the stomach under Fluoro.  There was  no resistance or heme on either dilator; however, the patient became  combative both times we put the dilators down and elected to stop the  procedure at this juncture.  The wire was redrawn back into the dilator,  both were removed in tandem.  The patient tolerated the procedure well  except for the dilation part.  There was no obvious, immediate  complication.   ENDOSCOPIC DIAGNOSES:  1. Tiny, little, small hiatal hernia with a widely patent ring.  2. Otherwise normal esophagogastroduodenoscopy.   THERAPY:  Savory dilatation under Fluoro to 15 mm without resistance or  heme.  Patient tolerated the dilation part well.   PLAN:  See her back in p.r.n. or in six weeks if the Savory dilation  works, slowly  advance diet, consider a balloon dilatation in the future  since that should be better tolerated since it is through the scope, and  if that is unsuccessful possibly then a Botox trial secondary to her  tertiary contractions on the x-ray, happy to see back sooner p.r.n.           ______________________________  Petra Kuba, M.D.     MEM/MEDQ  D:  05/25/2007  T:  05/25/2007  Job:  [id number]

## 2011-04-25 NOTE — Op Note (Signed)
[facility]  Patient:    Kelly Li, Kelly Li Visit Number: [id number] MRN: [id number]          Service Type: NES Location: NESC Attending Physician:  Londell Moh Dictated by:   Jamison Neighbor, M.D. Proc. Date: 12/13/01   CC:         Marinus Maw, M.D.   Operative Report  PREOPERATIVE DIAGNOSES: 1. Chronic pyelonephritis. 2. Chronic ureteropelvic junction obstruction.  POSTOPERATIVE DIAGNOSES: 1. Chronic pyelonephritis. 2. Chronic ureteropelvic junction obstruction.  OPERATION:  Simple nephrectomy.  SURGEON:  Jamison Neighbor, M.D.  ASSISTANTLynelle Smoke I. Patsi Sears, M.D.  ANESTHESIA:  General  COMPLICATIONS:  None.  DRAIN:  Foley catheter  BRIEF HISTORY:  This 77 year old female underwent a pyeloplasty by Dr. Patsi Sears for treatment of a chronic ureteropelvic junction obstruction. The patient had some problems with delayed function in the early postoperative period requiring placement of a stent.  The patient subsequently did not return to the office for over one year and upon her return had to have the stent removed.  This was done by Dr. Patsi Sears and the patient developed recurrent pain on the right hand side.  She was subsequently seen by me on an emergency basis and a stent was placed.  The patient subsequently underwent a ureteroscopic examination and was found to have some stricturing in the proximal ureter which was opened with a laser.  The JJ was removed after that procedure and the patient did well for several months but then developed a recurrence of her right sided pain.  She underwent a second placement of a stent with balloon dilation in that area.  The patient requested the stent be removed and that a trial be given to determine if she would drain with the stent removed.  The stent was removed and the patient had almost immediate return of her pain and required hospitalization for pain management and  antibiotics.  After careful review of her x-ray films, it was clear that the patient was not draining because of a residual obstruction at the original pyeloplasty site. The patient had a nephrostomy tube placed with immediate improvement in her symptoms and a decrease in her fever.  After careful discussion with the patient, with the patients family and with several of my partners, decision was made that the simplest thing for a woman this age was to take out the chronically infected kidney and not try to salvage the kidney.  The patient was given the option of salvage procedures including ileal ureter for ileal substitution as well as the possibility of a repeat pyeloplasty.  The patient has elected to undergo a simple nephrectomy.  DESCRIPTION OF PROCEDURE:  After successful induction of general anesthesia the patient was placed in the flank position.  The kidney rest was elevated and she was secured with the bean bag and tape.  The patients previous pyeloplasty incision was opened.  The nephrostomy tube that had been placed percutaneously was removed.  The retroperitoneal space was entered.  The kidney was identified.  The renal vein was encircled but not tied off.  The artery was identified.  It was triply tied and clipped and then divided. After that the vein was divided.  A second vein was identified and was also divided after appropriately tying it off with clips.  The ureter was found and was tied off with a Vicryl tie.  The adrenal gland was left in place.  Careful inspection showed the vena cava had not been  injured.  All the pedicle had been appropriately tied off.  There was no injury to the pleura.  The patient was then closed in three layers with Vicryl.  A nerve block was performed. The incision itself was also infiltrated.  The patient was then closed with surgical clips.  The patient tolerated the procedure and was taken to the recovery room in good condition. Dictated  by:   Jamison Neighbor, M.D. Attending Physician:  Londell Moh DD:  12/13/01 TD:  12/13/01 Job: [id number]

## 2011-04-25 NOTE — H&P (Signed)
[facility]  Patient:    Kelly Li, Kelly Li Visit Number: [id number] MRN: [id number]          Service Type: MED Location: 3W [phone] Attending Physician:  Londell Moh Dictated by:   Maretta Bees. Vonita Moss, M.D. Admit Date:  12/04/2001   CC:         Jamison Neighbor, M.D.  Marinus Maw, M.D.   History and Physical  HISTORY OF PRESENT ILLNESS:  This 77 year old lady has a complicated urologic history and she was found to have hematuria and found to have been diagnosed with having a right UPJ obstruction.  She underwent a right flank exploration and what seems to have been a right pyeloplasty by Dr. Patsi Sears in August of 2001 here at [facility].  She had a double J catheter postoperatively that was not removed until June of 2002.  After that she had recurrent bouts of right flank pain.  Dr. Marcelyn Bruins apparently found scarring and obstruction on the retrograde approach.  The patient told me he used laser therapy.  Even after that the obstruction and pain recurred and she had a stent replaced six weeks ago.  She has not tolerated the stent very well because of voiding discomfort and symptoms.  She has not had a lot of problem with UTIs in the past.  After her stent was removed yesterday morning about three or four hours later, she developed recurrent right flank pain and has developed increasing pain, nausea, vomiting, chills, fever.  She presented to the emergency room today, and I saw her at the request of Dr. Tamera Punt in the emergency room.  PAST MEDICAL HISTORY: 1. Hypothyroidism. 2. GERD. 3. She denies diabetes, heart attack, or strokes.  PREVIOUS SURGERIES: 1. Appendectomy. 2. Thyroidectomy. 3. Cholecystectomy. 4. Hysterectomy. 5. Urologic procedures as noted above.  CURRENT MEDICATIONS:  Macrobid, Prevacid, Levothroid 25 mcg a day.  ALLERGIES:  She has hay fever and also cannot tolerate DEMEROL and FLEXERIL.  HABITS:  She does  not smoke.  She does not drink alcohol.  SOCIAL HISTORY:  She lives at home with her husband who has Alzheimers.  MEDICAL DOCTOR:  Marinus Maw, M.D.  GASTROENTEROLOGIST:  Petra Kuba, M.D.  FAMILY HISTORY:  Positive for chronic hematuria in a sister.  REVIEW OF SYSTEMS:  Noted on her health history form.  PHYSICAL EXAMINATION:  VITAL SIGNS:  On presentation to the emergency room, included blood pressure 137/57, pulse 83, respiratory rate 18, temperature 102.5.  GENERAL:  She is a young-appearing lady for her age.  She was in distress but alert and oriented.  SKIN:  Very warm and dry.  NECK:  Supple.  CHEST:  Clear.  HEART:  Heart tones regular.  ABDOMEN:  She is guarded in the right flank.  There is no hepatosplenomegaly, no hernias, no inguinal nodes.  PELVIC:  Exam deferred.  EXTREMITIES:  No peripheral edema.  IMPRESSION: 1. Right flank pain and fever due to right pyelonephritis and suspected    recurrent right hydronephrosis. 2. Hypothyroidism. 3. Gastroesophageal reflux disease.  PLAN:  This lady will be admitted for IV antibiotics and IV analgesics.  Blood chemistries, CBC, cath urine, cath urine culture, and blood cultures will be obtained.  She will be started on gentamicin and Tequin IV.  I had a long discussion with the patient and her daughter.  In view of her intolerance to double J catheters, I am going to see if we can get her pain and  infection under control medically.  I told her that she needs to be admitted and may well need drainage of this kidney if she does not respond medically.  One option would be to put in a right percutaneous nephrostomy tube and assess for symptoms.  She also may need retrograde procedures and evaluation treatment performed.  I told her that there could be a possibility of a right nephrectomy to resolve this situation, although that would be the choice only after all other avenues of correction of her problem  had failed. Dictated by:   Maretta Bees. Vonita Moss, M.D. Attending Physician:  Londell Moh DD:  12/04/01 TD:  12/04/01 Job: [id number]

## 2011-04-25 NOTE — Op Note (Signed)
Kelly Li  Patient:    Kelly Li, Kelly Li                        MRN: [id number] Proc. Date: 06/11/01 Adm. Date:  [id number] Attending:  Londell Moh                           Operative Report  SERVICE:  Urology.  PREOPERATIVE DIAGNOSIS:  Recurrent UPJ obstruction status post pyeloplasty.  POSTOPERATIVE DIAGNOSIS:  Recurrent UPJ obstruction status post pyeloplasty.  PROCEDURE:  Cystoscopy, right retrograde right ureteroscopy, right retrograde endopyelotomy using holmium laser, and right double J catheter insertion.  SURGEON:  Dr. Logan Bores.  ANESTHESIA:  General.  COMPLICATIONS:  None.  DRAINS:  An 8.5 x 26 cm double J catheter.  BRIEF HISTORY:  This 77 year old female was found to have a symptomatic right UPJ obstruction. The patient underwent a pyeloplasty by Dr. Patsi Sears. The patient initially did well but then began to develop pain and it turned out that she had had a double J catheter and was inadvertently left behind. The patient had this removed in June of this year and the patient has had problems with pain every since that time. She had a renal scan performed which did show evidence of recurrent obstruction. The patient requested that this be repaired and it is being done by me in Dr. Hollie Beach absence. The patient understands the risks and benefits of the procedure. We have carefully explained to her the need to have a double J catheter replaced. I have also told her that we will plan to remove this in six weeks time and unless it migrates up into the ureter, this can be done safe in the office. The patient dose understand that there is on guarantee that endoscopic maneuvers will open up the ureter but she is willing to undergo ureteroscopy. Balloon dilation, endopyelotomy, and whatever else it will take to completely drain the kidney. The patient gave full and informed consent.  DESCRIPTION OF PROCEDURE:  After successful  induction of general anesthesia, the patient was placed in the dorsal lithotomy position and prepped with beta1 and draped in the usual sterile fashion. Cystoscopy was performed, the bladder was carefully inspected and was free of any tumor or stones. Both ureteral orifices were normal in configuration and location. Retrograde study performed on the right hand side showed that there was dilation of the ureter down to the UPJ but there was on real stricture in that area. The patient was found to have two tight bands in the more proximal ureter, one right at the UPJ and one just below the UPJ where a band was seen primarily on the medial aspect of the ureter. A guidewire was negotiated through these areas and that area was then dilated for five minutes with the balloon dilator. The distal ureter was dilated as well so that the entire ureter had been dilated up to 15. The ureteroscope was then inserted and the entire ureter was visualized. In the proximal ureter at the UPJ level, the scope could be advanced, however, there did appear to be a circumferential scar. It was felt that given the very large size of the pelvis that this opening was not enough to handle the large flow of urine. A decision was made to try and cut this with the holmium laser. The holmium laser was obtained and was set on the appropriate settings for ureteral stricture. Care  was taken to make sure that the cuts were done laterally in order to avoid any injury to vessels. The ureter was then cut until the opening sprung open. This was done under direct vision to make sure that no small crossing vessels were injured. The circumferential area was actually cut in two separate places and opened up beautifully with no ridge or bump noted as the scope was advanced from the ureter into the dilated pelvis. Just below that area, there was a more medially placed stricture that was also opened up allowing the ureter to dilate fully.  The remainder of the ureter was carefully inspected and there was no other areas of obvious stricture. The ureteroscope was removed, the double J was then advanced over the guidewire and allowed to coiled normally in the renal pelvis and was in the bladder. The large 8.5 French catheter was utilized. The bladder was drained, the patient tolerated the procedure well and was taken to the recovery room in good condition. DD:  06/11/01 TD:  06/11/01 Job: [id number]

## 2011-04-25 NOTE — Procedures (Signed)
[facility]  Patient:    Kelly Li, Kelly Li Visit Number: [id number] MRN: [id number]          Service Type: NES Location: [facility] Attending Physician:  Londell Moh Dictated by:   Jamison Neighbor, M.D. Proc. Date: 10/22/01 Admit Date:  10/22/2001                             Procedure Report  PREOPERATIVE DIAGNOSIS:  Ureteral obstruction, secondary to previous pyeloplasty.  POSTOPERATIVE DIAGNOSIS:  Ureteral obstruction, secondary to distal ureteral stricture.  PROCEDURE:  Cystoscopy, right ureteroscopy with holmium laser incision of ureteral stricture, right ureteral balloon dilation, and right double-J catheter insertion.  SURGEON:  Jamison Neighbor, M.D.  ANESTHESIA:  General.  COMPLICATIONS:  None.  DRAIN:  7.5 double-J catheter.  BRIEF HISTORY: This is a 77 year old female who was found to have UPJ obstruction.  She underwent a pyeloplasty by Sigmund I. Patsi Sears, M.D.  For reasons that were unclear, the stent was left in place for a prolonged period of time.  The patient eventually developed pain on that side, secondary to the obstruction of the stent.  The stent was removed, and the patient developed pain following that stent removal.  The patient presented to me and had a stent placed.  She subsequently underwent a ureteroscopic examination, which showed that there was redundant ureter proximally and some stricturing.  This area was opened with a laser, and a stent was replaced.  The patient did well after the stent but just recently again developed recurrent pain. This was several months out from the last ureteroscopic examination.  A pyeloplasty showed obstruction on that side.  The level of the obstruction still appeared to be at the UPJ level.  The patient was told that she probably had residual UPJ obstruction from the original pyeloplasty, and it was felt that a stent should be placed.  She was told that there options  including percutaneous placement, and long-term discussions were given to her concerning the best management of her problem. It was felt that the options to be considered would include Accusize balloon dilation, done in a retrograde fashion, an antegrade procedure done through the nephroscope or possible repeat pyeloplasty.  In addition, the patient was told that if the kidney function was very poor on scan that a nephrectomy had to be considered.  The scan actually shows that there was a significant function on the right-hand side.  The patient needs to have a stent placed because she has to take her husband to visit family in late November.  The patients husband is in very poor health, and it is crucial that she be capable of taking care of him during his remaining days.  For that reason, the decision was made to place a stent or to temporarily alleviate her obstruction and make long-term plans based on the findings at the retrograde evaluation.  The patient and her daughter understand the risks and benefits of the procedure.  An informed consent was obtained.  PROCEDURE:  After successful induction of general anesthesia, the patient was placed in the dorsolithotomy position, prepped with Betadine, and draped in the usual sterile fashion. Cystoscopy was performed.  The bowel was carefully inspected.  It was free of any tumor or stones.  The left ureteral orifice was completely normal in its appearance.  The right ureteral orifice shows evidence of previous balloon dilation as it is somewhat dilated but otherwise unremarkable.  Retrograde studies performed showed a normal distal ureter other than the fact that it is somewhat dilated from previous ureteroscopy, but towards the level of the iliac vessels, a tight stricture was identified. Some contrast does go through that area.  The proximal ureter is not well-delineated, but contrast could be seen up within the renal pelvis. Attempts at  passing a ureteral catheter through this area were unsuccessful, and 6, 5, and 4 French catheters could not be passed.  The ureteroscope was inserted alongside the guide wire, and it was noted that the patient had almost complete obliteration of the ureter.  With careful manipulation, a guide wire could be passed through the tight stricture, but nothing could be passed over the guide wire.  The decision was made to open this up slightly and very carefully using the holmium laser. The laser was advanced, and the cuts were made as superior and lateral as possible without any injury to the iliac vessels.  Only a minimal incision was made, and once it became clear that catheters could be passed, it was thought it would be best and safest to dilate that area with the balloon dilator. The ureteroscope was removed.  The cystoscope was backloaded over the wire, and the balloon dilator was passed. The strictured area was easily dilated to 15 atmospheres.  The more proximal ureter was easily dilated as well.  The curves in the area that had been seen at the time of the original ureteroscopy when the area was reopened did not appear to have occurred, and it appeared that the ureter was pretty straight in that area.  Once the strictured area had been opened up, the guide wire was left in place, and a 7 french 24 cm double-J catheter was passed.  This appeared to coil normally in the pelvis, as well as within the bladder.  The bladder was drained.  The patient tolerated the procedure well and was taken to the recovery room in good condition.  The patient will be given a prescription for Lorcet to take for pain, Pyridium Plus to take for any spasms, and she will be maintained on a one-a-day Macrobid.  The patient will return to the office in one weeks time to make sure she is capable of making her trip.  The options that will be discussed at the time would (1) include a trial of double-J removal to see if  dilating of the ureteral stricture takes care of her problem and (2) possible stent placement to open the ureteral  stricture and possible repeat ureteral exploration.  Should that be necessary, options to consider would have to include formal ureterolysis, repeat reimplant if necessary, and ureteral replacement. Dictated by:   Jamison Neighbor, M.D. Attending Physician:  Londell Moh DD:  10/22/01 TD:  10/22/01 Job: [id number]

## 2011-04-25 NOTE — Discharge Summary (Signed)
[facility]  Patient:    Kelly Li, Kelly Li                        MRN: [id number] Adm. Date:  [id number] Disc. Date: [id number] Attending:  Laqueta Jean                           Discharge Summary  DISCHARGE DIAGNOSIS:  Right uteropelvic junction obstruction.  OPERATION:  July 09, 2000, dismembered pyeloplasty.  HISTORY OF PRESENT ILLNESS:  [name] is an 77 year old white female with a history of chronic right flank pain and microhematuria.  She had IVP showing right UPJ obstruction, with follow-up Lasix renal scan showed right UPJ obstructive with severe restriction of drainage.  Physical examination showed pain in the sacroiliac joint, and she was originally treated with Celebrex 200 mg per day with some relief of hip pain but no relief of her back pain. The patient was observed over six months and a repeat Lasix renal scan showed 49% function on the right and 51% function on the left with normal arterial flow on the right and normal venous flow on the right side as well. Urinalysis showed occasional red cells and no white cells.  The patient was counseled as to dismembered pyeloplasty and admitted via the operating room.  PAST MEDICAL HISTORY:  Noncontributory.  SOCIAL HISTORY:  Alcohol and tobacco none.  MEDICATIONS: 1. Synthroid 0.125 mcg per day. 2. Prevacid one per day.  ALLERGIES:  DEMEROL (nausea and vomiting.)  REVIEW OF SYSTEMS:  Noncontributory.  PHYSICAL EXAMINATION:  Admission  exam shows temperature of 97 degrees, heart rate 74, respirations 16, blood pressure 126/60.  The remainder of the physical examination is as noted on July 09, 2000, H&P.  ADMISSION LABORATORY DATA: A hemoglobin of 13.1, hematocrit 39.1 with BUN of 18 and creatinine of 1.0.  HOSPITAL COURSE:  The patient underwent right dismembered pyeloplasty on the day of admission.  She did quite well and dressings were changed.  As noted the patient had a  Marcaine pump in place, and this helped her remain pain free with the use of minimal pain for postoperative oral or IV pain medications. The Marcaine pump was discontinued, and the patient was allowed to be discharged on July 12, 2000, doing well, eating with bowel movements.  She will return for double-J catheter removal in the office.  She was discharged in good condition. DD:  07/23/00 TD:  07/24/00 Job: [id number]

## 2011-04-25 NOTE — Op Note (Signed)
[facility]  Patient:    Kelly Li, Kelly Li                        MRN: [id number] Proc. Date: 07/09/00 Adm. Date:  [id number] Attending:  Laqueta Jean CC:         Marinus Maw, M.D.  Lindaann Slough, M.D.   Operative Report  PREOPERATIVE DIAGNOSIS:  Right ureteropelvic junction obstruction.  POSTOPERATIVE DIAGNOSIS:  Right ureteropelvic junction obstruction.  OPERATION:  Right vesticular cryopexy.  SURGEON:  Sigmund I. Patsi Sears, M.D.  ANESTHESIA:  General endotracheal.  PREPARATION:  After preoperative preanesthesia, the patient was brought to the operating room and placed on the table in the dorsal supine position where general endotracheal anesthesia was induced.  She was then placed in the left lateral decubitus position where the right flank was exposed with ________ and draped in the usual fashion.  DESCRIPTION OF PROCEDURE:  A 15 cm right flank incision was made over the right 12th rib.  _________ were identified and the right 12th rib was dissected with the rib instruments.  A tiny hole was identified in the pleura, which was addressed at the end of the case, placing a #8 red Robinson catheter, and giving the patient a deep breath, withdrawing the catheter, and oversewing the pinpoint hole.  The kidney was identified and the ureter was identified, and a right angle clamp was placed underneath the ureter and a vessel loop was placed.  The right UPJ was dissected.  A vessel was identified crossing over the UPJ and obstructing the UPJ, and this was ligated with 3-0 Vicryl suture.  Following this, the UPJ was identified by placing a 3-0 Vicryl suture in a position so that a marking pen could be used to mark out the area of incision. The UPJ was then incised, and the ureter was also incised.  The tissue was removed and sent to the laboratory.  The back side of the ureter was spatulated, and a suture was placed out from in the  typical position, to bring the ureter to the renal pelvis.  The front side of the renal pelvis was then sutured with 4-0 Vicryl suture.  The back wall was closed with interrupted 4-0 Vicryl sutures and the front wall of the anastomosis was closed with interrupted 4-0 Vicryl suture.  The superior portion of the renal pelvis was then closed with running 4-0 Vicryl suture.  A 6 x 26 double-J catheter was passed into the renal pelvis and down the ureter prior to closure of the front wall of the renal pelvis.  A 10 Blake drain was passed through a separate stab wound in the incision, and some perirenal fat was placed over the UPJ, and sutured with 3-0 Vicryl.  Following this, the wound was closed in the following fashion; the fascia of the internal and external oblique were closed with running #1 PDS suture.  The fascia of the external oblique was closed with the same suture.  The fascia of the transversus abdominis was closed in the same fashion.  Marcaine was injected in the wound, and Marcaine infusion pump was placed as well.  The patient was given Toradol, awakened, and taken to the recovery room in good condition. DD:  07/09/00 TD:  07/10/00 Job: [id number]

## 2011-04-25 NOTE — Discharge Summary (Signed)
[facility]  Patient:    Kelly Li, Kelly Li Visit Number: [id number] MRN: [id number]          Service Type: NES Location: [facility] Attending Physician:  Londell Moh Dictated by:   Jamison Neighbor, M.D. Admit Date:  12/07/2001 Disc. Date: 12/16/01   CC:         Marinus Maw, M.D.   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  SECONDARY DIAGNOSES: 1. Esophageal reflux disease. 2. Chronic obstructive pulmonary disease.  PRINCIPAL PROCEDURE:  Nephrectomy performed on December 13, 2001.  Please note that the code summary says nephroureterectomy which is in error.  It is a nephrectomy.  HISTORY OF PRESENT ILLNESS:  This 77 year old female has a complicated urologic history.  The patient was found to have a right UPJ obstruction.  She underwent a right pyeloplasty by Dr. Patsi Sears back in August 2001.  Patient had a double J catheter placed.  It was removed and then replaced by Dr. Patsi Sears.  It apparently was left in place until June 2002.  That was removed and the patient almost immediately had recurrent right flank pain. She was found to have obstruction on retrogrades.  She had a stent placed and this took her pain away.  The stent was removed and the pain came back.  She subsequently had this opened up with a laser and did tolerate removal of the stent for a few months, but the symptoms returned.  The patient had a stent placed one more time and felt better.  At the time of that last stent placement, the patient also had some stricturing in the distal ureter.  She was told that the best option for her would be to have that removed and an additional retrograde studies performed to check and see if that had opened up.  But, the patient felt quite uncomfortable around the end of December and asked that it be removed.  This was removed and the patient almost  immediately developed a recurrence of her pain.  In addition, she had some problems with vomiting, chills, and fever.  She was admitted to the hospital by Dr. Vonita Moss on December 04, 2001.  PAST MEDICAL HISTORY:  Remarkable for hypothyroidism, esophageal reflux disease.  PAST SURGICAL HISTORY:  Appendectomy, thyroidectomy, cholecystectomy, hysterectomy, and the UPJ repair.  ADMISSION MEDICATIONS:  Macrobid, Prevacid, and Levothroid.  FAMILY HISTORY, SOCIAL HISTORY, REVIEW OF SYSTEMS:  Placed in her initial history and physical that was performed by Dr. Vonita Moss.  HOSPITAL COURSE:  The patient was treated for what appeared to be pyelonephritis and she was carefully monitored.  While her pyelonephritic symptoms improved with appropriate antibiotic therapy and her temperature came down, she still felt as if she was obstructed on the right-hand side.  The white count had dropped down to 10.4 by December 06, 2001.  The electrolytes remained normal and the patient was able to tolerate a clear liquid diet. Because she still had problems with flank pain, however, it was eventually decided that she should undergo placement of a percutaneous tube.  It was felt that this would be safer and easier for her than undergoing an additional procedure to place the double J catheter back.  This was done because the patients temperature continued to spike up to 101 degrees despite the fact that she had normalization of her white count.  Patient had broadening of her antibiotic coverage with gentamicin.  She had blood cultures that showed Klebsiella and  it was felt that she suffered from a Klebsiella bacteremia of urinary tract origins.  The patient did feel better after the percutaneous tube was placed.  The patient had a careful discussion with several of the urologists and we did feel that the patients obstruction had not been cleared.  She was given several options for therapy including  repeat pyeloplasty, adenopyelotomy, replacement of the ureter, or nephrectomy.  Given the patients age, the quality of her other kidney, and the chronicity of her problem, the decision was made that it might best to go ahead and perform a nephrectomy.  The patient did feel better and was given the option of going home and having me start at another time.  But, there were numerous social issues that made it simpler for her to stay in the hospital and have the nephrectomy performed.  The patient was taken to the operating room on December 13, 2001, to undergo a nephrectomy for management of her chronic pyelonephritis.  Patients postoperative course was unremarkable and she had mild nausea, but this cleared fairly quickly.  She had some temperature spikes in the early postoperative period which did come down without much difficulty. Patients bowels were able to move.  She was able to void on her own and she was ready for discharge by December 16, 2001.  The patient felt that some of the nausea that she had suffered was caused by a change in the schedule of her Synthroid and Prevacid, and she felt that, if she could take it on her own schedule at home, she would do significantly better.  The patient did have a PICC line in place during the hospital stay.  Because of IV access issues, that was removed without difficulty.  Patient will be returning to my office for staple removal and follow-up next week. Dictated by:   Jamison Neighbor, M.D. Attending Physician:  Londell Moh DD:  12/29/01 TD:  12/30/01 Job: [id number]

## 2011-04-25 NOTE — H&P (Signed)
[facility]  Patient:    Kelly Li, Kelly Li                        MRN: [id number] Adm. Date:  [id number] Attending:  Laqueta Jean                         History and Physical  HISTORY:  Kelly Li is an 77 year old white female, with a history of chronic right flank pain, and a history of microhematuria.  The patient had intravenous pyelography showing a right ureteropelvic junction obstruction with followup Lasix renal scan which is consistent with right UPJ obstruction. Her physical examination showed pain in her sacroiliac joint and she was treated with Celebrex 200 mg per day, with some relief of her hip pain, but no relief of her back pain; this was accomplished in January of 2001.  The patient was observed over the next six months and underwent repeat Lasix renal scanning, which showed complete right UPJ obstruction, with no response to Lasix.  She had 49% function on the right and 51% function on the left, with normal arterial flow on the right side and normal venous flow on the right as well.  Her urinalysis showed occasional red cell and no white cells and the patient was counseled with regard to dismembered pyeloplasty for UPJ obstruction.  PAST MEDICAL HISTORY:  Noncontributory.  SOCIAL HISTORY:  Alcohol and tobacco:  None.  MEDICATIONS:  None.  REVIEW OF SYSTEMS:  Review of systems is significant for occasional asthma.  MEDICATIONS 1. Synthroid 0.125 mcg per day. 2. Prevacid one per day.  ALLERGIES:  DEMEROL (nausea and vomiting).  PHYSICAL EXAMINATION  VITAL SIGNS:  Temperature 97 degrees, heart rate 74, respiratory rate 16, blood pressure 126/60.  HEENT:  PERRL.  EOM full.  NECK:  Supple.  Nontender.  No nodes.  CHEST:  Clear to P&A.  ABDOMEN:  Soft.  Positive bowel sounds.  Without organomegaly.  Without masses.  There is right flank pain to percussion.  GENITALIA:  Normal female external genitalia.  RECTAL:  Not  indicated at this examination.  BREASTS:  Not indicated at this examination.  EXTREMITIES:  Without cyanosis or edema.  NEUROLOGIC:  Physiologic.  PLAN:  Admit via OR for a right dismembered pyeloplasty. DD:  07/09/00 TD:  07/09/00 Job: [id number]

## 2011-04-25 NOTE — Op Note (Signed)
   NAME:  Kelly Li, Kelly Li                           ACCOUNT NO.:  [id number]   MEDICAL RECORD NO.:  [id number]                   PATIENT TYPE:  AMB   LOCATION:  ENDO                                 FACILITY:  [facility], [place]   PHYSICIAN:  Petra Kuba, M.D.                 DATE OF BIRTH:  Feb 02, 1917   DATE OF PROCEDURE:  10/27/2002  DATE OF DISCHARGE:                                 OPERATIVE REPORT   PROCEDURE:  Colonoscopy.   INDICATIONS FOR PROCEDURE:  Guaiac positivity due for colonic screening.  Consent was signed after risks, benefits, methods, and options were  thoroughly discussed in the office on multiple occasions.   MEDICINES USED:  Fentanyl 62.5, Versed 5.   DESCRIPTION OF PROCEDURE:  Rectal inspection was pertinent for external  hemorrhoids, small. Digital exam was negative. The pediatric video  adjustable colonoscope was inserted, easily advanced around the colon to the  cecum. This did require some abdominal pressure but no position changes. The  cecum was identified by the appendiceal orifice and the ileocecal valve. In  fact, the scope was inserted a short ways into the terminal ileum. Other  than some left greater right diverticula, no other abnormalities were seen  on insertion.  The scope was slowly withdrawn. The prep was adequate. There  was some liquid stool that required washing and suctioning on slow  withdrawal through the colon. Other than a rare right and left diverticula,  no polyps, tumors, masses or other abnormalities were seen as we slowly  withdrew back to the rectum. Once back in the rectum, the scope was  retroflexed pertinent for some internal hemorrhoids, small. The scope was  straightened and readvanced a short ways up the left side of the colon, air  was suctioned, scope removed. The patient tolerated the procedure well.  There was no obvious or immediate complication.   ENDOSCOPIC DIAGNOSIS:  1. Small internal and external hemorrhoids.  2. Left  greater than right rare diverticula.  3. Otherwise within normal limits to the terminal ileum without any blood     being seen.   PLAN:  Follow-up p.r.n. or in 6-8 weeks to recheck his guaiacs, probable  CBCs and symptoms and decided if an upper tract workup or any other workup  plans are needed; otherwise, return care to Dr. Oneta Rack for the customary  health care maintenance and screening.                                               Petra Kuba, M.D.    MEM/MEDQ  D:  10/27/2002  T:  10/27/2002  Job:  [id number]   cc:   Lucky Cowboy, M.D.  [address]  [place], [place] [address]  Fax: [phone]

## 2011-04-30 NOTE — Discharge Summary (Signed)
[name], [name]                 ACCOUNT NO.:  [id number]  MEDICAL RECORD NO.:  [id number]           PATIENT TYPE:  I  LOCATION:  1612                         FACILITY:  [facility]  PHYSICIAN:  Ollen Gross, M.D.    DATE OF BIRTH:  [birth year]  DATE OF ADMISSION:  04/12/2011 DATE OF DISCHARGE:  04/17/2011                        DISCHARGE SUMMARY - REFERRING   ADMITTING DIAGNOSES: 1. Right impacted femoral neck fracture. 2. Anemia. 3. Seasonal allergies. 4. Asthma. 5. Chronic back pain. 6. Gastroesophageal reflux disease. 7. Hypothyroidism. 8. Anxiety. 9. Occasional migraines. 10.Hypertension.  DISCHARGE DIAGNOSIS: 1. Right femoral neck fracture status post in situ hip pinning. 2. Postop leukocytosis. 3. Postop atelectasis, probable cause for the leukocytosis. 4. Anemia. 5. Seasonal allergies. 6. Asthma. 7. Chronic back pain. 8. Gastroesophageal reflux disease. 9. Hypothyroidism. 10.Anxiety. 11.Occasional migraines. 12.Hypertension.  PROCEDURE:  Apr 14, 2011, right hip pinning.  Surgeon, Dr. Lequita Halt. Anesthesia, general.  CONSULTS:  [facility] Dr. Lonia Blood.  BRIEF HISTORY:  The patient is a 77 year old female who sustained a mechanical fall and injury to her right hip.  She was admitted to [facility] and evaluated by Dr. Lequita Halt and planned for surgery once medically cleared.  LABORATORY DATA:  The CBC on admission, low hemoglobin 11.7, hematocrit 34.1, white cell count 8.9, platelets 204.  Admission BMET in the ER showed sodium low at 131, BUN high at 25, creatinine high at 169, glucose 122.  Serial CBCs were followed.  Hemoglobin declined a little bit after surgery down to 10.9, then 9.5 which was lowest point and stabilized at 9.5 and hematocrit 28.9.  Serial BMETs were followed.  The sodium came up to level of 137, last noted at 136.  Creatinine improved down to 124, glucose improved down to 109, BUN came down to normal level of 12.  She did  have an extended Chem panel noted on hospital day #2 that showed elevated AST of 79, elevated ALT of 40, total protein low at 5.5, albumin low of 3.1, calcium low at 8.2.  Also the TSH level taken on hospital day #2 which was normal at 2.755.  She had an iron study noted on Apr 15, 2011, low iron, UIBC was 152.  Vitamin B12 of 375. Serum folate of 16.4, ferritin of 285.  A UA was taken on Apr 16, 2011, which was negative.  Blood cultures were taken on Apr 14, 2011, preliminary were negative.  Finals were pending at time of dictation.  X-rays on admission, right ankle films with no acute findings.  Right hip films showed impacted subcapital right femoral neck fracture.  One- view chest, stable exam, no active disease.  CT of the head and neck: No acute intracranial abnormalities, no evidence of cervical spine fracture or subluxation, advanced cervical spondylosis, stable mild cerebral atrophy and small-vessel disease.  Right hip films taken on Apr 14, 2011:  Three fixation screws in right femoral neck subcapital fracture.  Portable chest taken on Apr 15, 2011 showed diffuse interstitial edema and small left effusion associated with bibasilar atelectasis.  HOSPITAL COURSE:  The patient admitted to [facility], placed at  bedrest and underwent evaluation by medical services.  It was felt that she was medically stable to undergo the procedure.  She was preop and she was admitted over the weekend and added on Monday.  She underwent the above procedure on Apr 14, 2011, without difficulty.  She remained in the hospital.  Medical services through [facility] did follow her throughout the hospital course.  She did have a little bit of temp postop and her chest x-ray was checked and showed to have some atelectasis.  Her urine was checked which was found to be normal, it was felt that was the reason for the leukocytosis.  She is encouraged incentive spirometer.  She is allowed to be  weightbearing as tolerated postop.  It was felt that she would benefit from a short-term stay in rehab, so we got social work involved, this was discussed with the patient and family.  On postop day #1, she was actually doing much better following her surgery.  Postop day #1 was Apr 15, 2011, that is when we got the social work involved.  By day #2, she was doing little bit better.  She had little bit of temp right around surgery and that is when the urinalysis and chest x-ray were taken.  Hemoglobin was down to 9.5 by postop day #2.  She was asymptomatic with this, her pulse was fine, her pressure was fine.  She was starting to do pivot steps and taking few steps in the room.  FL-2 was set out and she wanted to look into [facility] which is close to her family.  I continued to encourage mobility.  By Apr 17, 2011, a bed had been available or located at [facility].  Her temp which had gone up initially after surgery had come back down and she was afebrile the last couple of days in the hospital stay. Her urine was negative.  She was asymptomatic and it was felt she would benefit from undergoing [facility] at that time.  DISCHARGE/PLAN: 1. The patient was transferred over to [facility] on     Apr 17, 2011. 2. Discharge diagnoses, please see above. 3. Discharge meds.  Current medications at time of transfer include: 1. Protonix 40 mg daily. 2. Levothyroxine 100 mcg daily. 3. Advair 100/50 inhaler b.i.d. 4. Ocuvite tablets daily. 5. Multivitamin daily. 6. Claritin 10 mg daily. 7. Vitamin D 5000 units p.o. b.i.d. 8. Colace 100 mg p.o. b.i.d. 9. Ventolin nebulizer b.i.d. p.r.n. 10.Atrovent nebulizer b.i.d. p.r.n. 11.Lovenox subcu injection daily for 5 more days, then discontinue the     Lovenox. 12.Ditropan 5 mg p.o. b.i.d. 13.Norvasc 5 mg p.o. daily. 14.Ferrous sulfate 325 mg twice a day. 15.Xanax 0.25 mg q.8 h p.r.n. anxiety. 16.Laxative of  choice. 17.Enema of choice. 18.Fioricet 1/2 tablet to 1 tablet every 4 hours as needed for     headaches. 19.Ranitidine 300 mg q.h.s. 20.Tylenol 325 one or two every 4 to 6 hours as needed for mild pain,     temperature, or headache. 21.Robaxin 500 mg p.o. q.6-8 h p.r.n. spasm. 22.MiraLax 17 g in 8 ounces of water daily p.r.n. constipation. 23.Ultracet 1 or 2 tablets every 6 hours as needed for pain. 24.She may also resume her lisinopril/HCTZ 20/12.5 one-half tablet     daily.  DIET:  Heart healthy diet.  ACTIVITY:  She is weightbearing as tolerated to the right lower extremity.  PT and OT for gait training, ambulation, ADLs.  Please note she  may start showering once she is transferred to [facility], however, do not submerge incision under water.  FOLLOWUP:  She is to follow up by Dr. Lequita Halt in the office in about 2 weeks either on Tuesday 22nd or Thursday the 24th.  Please contact the office at [phone] to help arrange appointment and followup care of this patient.  DISPOSITION:  Going to [facility].  CONDITION ON DISCHARGE:  Slowly improving.     Alexzandrew L. Julien Girt, P.A.C.   ______________________________ Ollen Gross, M.D.    ALP/MEDQ  D:  04/17/2011  T:  04/17/2011  Job:  [id number]  cc:   Lonia Blood, M.D.  Electronically Signed by Patrica Duel P.A.C. on 04/24/2011 10:36:52 AM Electronically Signed by Ollen Gross M.D. on 04/30/2011 12:54:09 PM

## 2011-06-01 ENCOUNTER — Inpatient Hospital Stay (HOSPITAL_COMMUNITY)
Admission: EM | Admit: 2011-06-01 | Discharge: 2011-06-10 | DRG: 854 | Disposition: A | Discharge status: Skilled Nursing Facility | Payer: Medicare Other | Attending: Internal Medicine | Admitting: Internal Medicine

## 2011-06-01 ENCOUNTER — Emergency Department (HOSPITAL_COMMUNITY): Payer: Medicare Other

## 2011-06-01 DIAGNOSIS — D62 Acute posthemorrhagic anemia: Secondary | ICD-10-CM | POA: Diagnosis not present

## 2011-06-01 DIAGNOSIS — J4489 Other specified chronic obstructive pulmonary disease: Secondary | ICD-10-CM | POA: Diagnosis present

## 2011-06-01 DIAGNOSIS — A419 Sepsis, unspecified organism: Secondary | ICD-10-CM | POA: Diagnosis present

## 2011-06-01 DIAGNOSIS — E039 Hypothyroidism, unspecified: Secondary | ICD-10-CM | POA: Diagnosis present

## 2011-06-01 DIAGNOSIS — E871 Hypo-osmolality and hyponatremia: Secondary | ICD-10-CM | POA: Diagnosis present

## 2011-06-01 DIAGNOSIS — J449 Chronic obstructive pulmonary disease, unspecified: Secondary | ICD-10-CM | POA: Diagnosis present

## 2011-06-01 DIAGNOSIS — M549 Dorsalgia, unspecified: Secondary | ICD-10-CM | POA: Diagnosis present

## 2011-06-01 DIAGNOSIS — G8929 Other chronic pain: Secondary | ICD-10-CM | POA: Diagnosis present

## 2011-06-01 DIAGNOSIS — D509 Iron deficiency anemia, unspecified: Secondary | ICD-10-CM | POA: Diagnosis present

## 2011-06-01 DIAGNOSIS — I10 Essential (primary) hypertension: Secondary | ICD-10-CM | POA: Diagnosis present

## 2011-06-01 DIAGNOSIS — IMO0002 Reserved for concepts with insufficient information to code with codable children: Secondary | ICD-10-CM | POA: Diagnosis present

## 2011-06-01 DIAGNOSIS — K2940 Chronic atrophic gastritis without bleeding: Secondary | ICD-10-CM | POA: Diagnosis present

## 2011-06-01 DIAGNOSIS — K222 Esophageal obstruction: Secondary | ICD-10-CM | POA: Diagnosis present

## 2011-06-01 DIAGNOSIS — S72009S Fracture of unspecified part of neck of unspecified femur, sequela: Secondary | ICD-10-CM

## 2011-06-01 DIAGNOSIS — W19XXXS Unspecified fall, sequela: Secondary | ICD-10-CM

## 2011-06-01 DIAGNOSIS — K219 Gastro-esophageal reflux disease without esophagitis: Secondary | ICD-10-CM | POA: Diagnosis present

## 2011-06-01 LAB — COMPREHENSIVE METABOLIC PANEL
Alkaline Phosphatase: 67 U/L (ref 39–117)
BUN: 29 mg/dL — ABNORMAL HIGH (ref 6–23)
GFR calc Af Amer: 41 mL/min — ABNORMAL LOW (ref >60)
Glucose, Bld: 110 mg/dL — ABNORMAL HIGH (ref 70–99)
Potassium: 3.9 mEq/L (ref 3.5–5.1)
Total Bilirubin: 0.8 mg/dL (ref 0.3–1.2)
Total Protein: 6.2 g/dL (ref 6.0–8.3)

## 2011-06-01 LAB — URINALYSIS, ROUTINE W REFLEX MICROSCOPIC
Bilirubin Urine: NEGATIVE
Ketones, ur: NEGATIVE mg/dL
Nitrite: POSITIVE — AB
Protein, ur: 30 mg/dL — AB
Specific Gravity, Urine: 1.015 (ref 1.005–1.030)
Urobilinogen, UA: 0.2 mg/dL (ref 0.0–1.0)

## 2011-06-01 LAB — PROCALCITONIN: Procalcitonin: 3.91 ng/mL

## 2011-06-01 LAB — DIFFERENTIAL
Eosinophils Relative: 0 % (ref 0–5)
Lymphocytes Relative: 4 % — ABNORMAL LOW (ref 12–46)
Lymphs Abs: 0.4 10*3/uL — ABNORMAL LOW (ref 0.7–4.0)
Monocytes Absolute: 0.6 10*3/uL (ref 0.1–1.0)
Monocytes Relative: 5 % (ref 3–12)

## 2011-06-01 LAB — CBC
HCT: 30.7 % — ABNORMAL LOW (ref 36.0–46.0)
MCV: 86.2 fL (ref 78.0–100.0)
RDW: 13.6 % (ref 11.5–15.5)
WBC: 10.9 10*3/uL — ABNORMAL HIGH (ref 4.0–10.5)

## 2011-06-02 ENCOUNTER — Inpatient Hospital Stay (HOSPITAL_COMMUNITY): Payer: Medicare Other

## 2011-06-02 LAB — CBC
HCT: 28.6 % — ABNORMAL LOW (ref 36.0–46.0)
Hemoglobin: 9.1 g/dL — ABNORMAL LOW (ref 12.0–15.0)
MCH: 28 pg (ref 26.0–34.0)
MCV: 88 fL (ref 78.0–100.0)
RBC: 3.25 MIL/uL — ABNORMAL LOW (ref 3.87–5.11)

## 2011-06-02 LAB — IRON AND TIBC: UIBC: 153 ug/dL

## 2011-06-02 LAB — VITAMIN B12: Vitamin B-12: 457 pg/mL (ref 211–911)

## 2011-06-02 LAB — BASIC METABOLIC PANEL
CO2: 25 mEq/L (ref 19–32)
Calcium: 8.8 mg/dL (ref 8.4–10.5)
Creatinine, Ser: 1.48 mg/dL — ABNORMAL HIGH (ref 0.50–1.10)
Glucose, Bld: 107 mg/dL — ABNORMAL HIGH (ref 70–99)

## 2011-06-02 NOTE — H&P (Signed)
NAME:  Kelly Li, Kelly Li NO.:  [id number]  MEDICAL RECORD NO.:  [id number]  LOCATION:  WLED                         FACILITY:  [facility]  PHYSICIAN:  Standley Dakins, MD   DATE OF BIRTH:  Apr 16, 1917  DATE OF ADMISSION:  06/01/2011 DATE OF DISCHARGE:                             HISTORY & PHYSICAL   PRIMARY CARE PHYSICIAN:  Lucky Cowboy, MD  ORTHOPEDIST:  Ollen Gross, MD  CHIEF COMPLAINT:  Cold chills.  Historians:  The patient and daughter.  HISTORY OF PRESENT ILLNESS:  This patient is a 77 year old female presenting to the emergency department with her daughter who reports that since yesterday she has been developing cold chills.  She reports that she became concerned when she noticed the chills increasing and her body shaking.  She reports that she had no fever.  She has had some increased urination over the past several weeks.  She reports that she noticed that after she had been catheterized when she was hospitalized in May of this year for repair of her right hip fracture, she has been increasing her urination very frequently.  The patient reports that she has also noted some pain in her right hip fracture repair over the past week.  The patient denies noticing any blood in her urine.  She denies chest pain and shortness of breath.  She has had some generalized weakness and dry mouth.  PAST MEDICAL HISTORY: 1. Seasonal allergies. 2. Asthma. 3. Chronic back pain. 4. GERD. 5. Hypothyroidism. 6. Anxiety disorder. 7. History of migraine headaches. 8. Status post recent fracture of the right hip in May 2012. 9. Hypertension.  PAST SURGICAL HISTORY: 1. Right kidney removal. 2. Right femoral neck fracture, status post in situ hip pinning in May     2012.  CURRENT HOME MEDICATIONS: 1. ProAir inhaler 2 puffs every 4 hours as needed. 2. Advair Diskus 100/50 inhaled twice daily as needed. 3. Multivitamins 1 p.o. daily. 4. Protonix 40 mg 1 p.o.  daily 5. Lisinopril 20 mg 1 p.o. daily. 6. Oxybutynin 5 mg 1 p.o. three times daily. 7. Methocarbamol 500 mg 1 tablet every 6 hours p.r.n. 8. Tramadol 50 mg 1 tablet four times daily as needed. 9. Levothyroxine 100 mcg 1 p.o. every morning. 10.Vitamin D 5000 units 1 tablet daily. 11.I-Caps 1 p.o. daily. 12.Aspirin enteric-coated 81 mg 1 p.o. daily. 13.Tylenol arthritis 650 mg 1 tablet twice daily as needed for pain.  ALLERGIES:  DEMEROL, CODEINE, FLEXERIL, FELDENE, and MORPHINE SULFATE.  SOCIAL HISTORY:  This patient lives alone in [place], [place]. She has two grown-up children; they are very supportive.  The patient has no history of tobacco, alcohol or recreational drug use.  The patient has been able to perform her ADLs without support.  FAMILY HISTORY:  Significant for multiple kidney problems; a sister who has had hematuria, and a daughter who has had significant kidney problems also requiring nephrectomy.  REVIEW OF SYSTEMS:  Significant for all of the systems listed in the HPI, otherwise all systems reviewed completely with the patient and reported as negative.  PHYSICAL EXAMINATION:  CURRENT VITAL SIGNS:  Temperature 100.6 oral, pulse 82, respirations 17, blood pressure 104/44,  pulse ox 97% on room air.  GENERAL:  This is a well-developed female.  She is awake, alert, appears younger than stated age. HEENT:  Normocephalic, atraumatic.  Mucous membranes dry. NECK:  Supple.  Thyroid, no nodules or masses palpated. LUNGS:  Bilateral breath sounds clear to auscultation. CARDIOVASCULAR:  Normal S1, S2 sounds without murmurs, rubs or gallops. ABDOMEN:  Soft, nondistended, nontender.  Mild suprapubic tenderness noted. EXTREMITIES:  Mild edema in the right lower extremity.  Pedal pulses 2+ bilateral. NEUROLOGIC:  No focal deficits noted. SKIN:  No gross lesions noted.  LABORATORY DATA:  Sodium 129, potassium 3.9, chloride 96, bicarb 23, BUN 29, creatinine 1.45,  glucose 110, calcium 9.3, estimated GFR 34.  Lactic acid less than 0.2.  White blood cell count 10.9, hemoglobin 10.0, hematocrit 30.7, platelet count 283.  Procalcitonin 3.91.  Urinalysis reveals too many/numerous white blood cells per high-power field to count, many bacteria noted, 3-6 red blood cells per high-power field noted.  IMAGING STUDIES: 1. X-ray of the right hip reveals some retraction of the screws within     the right proximal femur across the femoral neck fracture.  There     is new varus angulation across the fractures and these are new     findings since the intraoperative images. 2. Abdominal x-ray:  No obstruction or free air seen. 3. Chest x-ray significant with COPD, no active disease.  IMPRESSION: 1. Urinary tract infection with cystitis, hematuria. 2. Asthma. 3. Status post recent fracture to right hip with in situ pinning, now     with changes in the position of the screws. 4. Anemia. 5. Hyponatremia, chronic. 6. Hypertension. 7. Gastroesophageal reflux disease. 8. Chronic back pain. 9. Hypothyroidism.  PLAN: 1. The patient is going to be admitted into the hospital for further     evaluation and management.  The patient will be started on IV     ceftriaxone to treat the urinary tract infection.  Gentle IV fluids     have been ordered to treat her mild clinical dehydration. 2. Urine cultures, blood cultures have been ordered. 3. Will ask the patient's orthopedic specialist to evaluate the change     in screw position in the right hip for clinical significance. 4. The patient is Do Not Resuscitate. 5. Resume home medications for asthma and GERD. 6. Check a TSH level. 7. Continue to monitor the patient closely, make adjustments to     medical care as needed.     Standley Dakins, MD     CJ/MEDQ  D:  06/01/2011  T:  06/01/2011  Job:  [id number]  Electronically Signed by Standley Dakins  on 06/02/2011 12:20:20 PM

## 2011-06-03 ENCOUNTER — Inpatient Hospital Stay (HOSPITAL_COMMUNITY): Payer: Medicare Other

## 2011-06-03 ENCOUNTER — Other Ambulatory Visit: Payer: Self-pay | Admitting: Gastroenterology

## 2011-06-03 LAB — BASIC METABOLIC PANEL
BUN: 20 mg/dL (ref 6–23)
CO2: 24 mEq/L (ref 19–32)
Calcium: 8.8 mg/dL (ref 8.4–10.5)
Creatinine, Ser: 1.2 mg/dL — ABNORMAL HIGH (ref 0.50–1.10)
GFR calc non Af Amer: 42 mL/min — ABNORMAL LOW (ref >60)
Glucose, Bld: 110 mg/dL — ABNORMAL HIGH (ref 70–99)
Sodium: 138 mEq/L (ref 135–145)

## 2011-06-03 LAB — URINE CULTURE: Colony Count: >=100000

## 2011-06-03 LAB — DIFFERENTIAL
Basophils Absolute: 0 10*3/uL (ref 0.0–0.1)
Basophils Relative: 0 % (ref 0–1)
Eosinophils Absolute: 0.3 10*3/uL (ref 0.0–0.7)
Eosinophils Relative: 4 % (ref 0–5)
Lymphocytes Relative: 22 % (ref 12–46)
Monocytes Absolute: 0.9 10*3/uL (ref 0.1–1.0)

## 2011-06-03 LAB — CBC
HCT: 29.1 % — ABNORMAL LOW (ref 36.0–46.0)
MCH: 28.3 pg (ref 26.0–34.0)
MCHC: 32 g/dL (ref 30.0–36.0)
MCV: 88.4 fL (ref 78.0–100.0)
Platelets: 235 10*3/uL (ref 150–400)
RDW: 13.9 % (ref 11.5–15.5)
WBC: 7 10*3/uL (ref 4.0–10.5)

## 2011-06-03 LAB — SODIUM, URINE, RANDOM: Sodium, Ur: 24 mEq/L

## 2011-06-03 LAB — CREATININE, URINE, RANDOM: Creatinine, Urine: 28.6 mg/dL

## 2011-06-04 LAB — BASIC METABOLIC PANEL
CO2: 23 mEq/L (ref 19–32)
Calcium: 8.2 mg/dL — ABNORMAL LOW (ref 8.4–10.5)
Creatinine, Ser: 1.02 mg/dL (ref 0.50–1.10)
GFR calc non Af Amer: 51 mL/min — ABNORMAL LOW (ref >60)
Sodium: 135 mEq/L (ref 135–145)

## 2011-06-04 LAB — CULTURE, BLOOD (ROUTINE X 2): Culture  Setup Time: 201206242033

## 2011-06-04 LAB — CBC
MCH: 28.6 pg (ref 26.0–34.0)
MCHC: 32.3 g/dL (ref 30.0–36.0)
MCV: 88.4 fL (ref 78.0–100.0)
Platelets: 235 10*3/uL (ref 150–400)
RBC: 3.01 MIL/uL — ABNORMAL LOW (ref 3.87–5.11)
RDW: 13.7 % (ref 11.5–15.5)

## 2011-06-05 LAB — BASIC METABOLIC PANEL
BUN: 8 mg/dL (ref 6–23)
CO2: 26 mEq/L (ref 19–32)
Calcium: 7.9 mg/dL — ABNORMAL LOW (ref 8.4–10.5)
Creatinine, Ser: 0.9 mg/dL (ref 0.50–1.10)
Glucose, Bld: 97 mg/dL (ref 70–99)

## 2011-06-05 LAB — CBC
Hemoglobin: 9.8 g/dL — ABNORMAL LOW (ref 12.0–15.0)
MCH: 28.3 pg (ref 26.0–34.0)
MCHC: 32.2 g/dL (ref 30.0–36.0)
MCV: 87.9 fL (ref 78.0–100.0)
RBC: 3.46 MIL/uL — ABNORMAL LOW (ref 3.87–5.11)

## 2011-06-06 LAB — BASIC METABOLIC PANEL
BUN: 6 mg/dL (ref 6–23)
Calcium: 7.8 mg/dL — ABNORMAL LOW (ref 8.4–10.5)
Chloride: 103 mEq/L (ref 96–112)
Creatinine, Ser: 1.06 mg/dL (ref 0.50–1.10)
GFR calc Af Amer: 59 mL/min — ABNORMAL LOW (ref >60)
GFR calc non Af Amer: 49 mL/min — ABNORMAL LOW (ref >60)

## 2011-06-06 LAB — CBC
HCT: 26.8 % — ABNORMAL LOW (ref 36.0–46.0)
MCH: 29.1 pg (ref 26.0–34.0)
MCHC: 33.6 g/dL (ref 30.0–36.0)
MCV: 86.7 fL (ref 78.0–100.0)
RDW: 14.1 % (ref 11.5–15.5)

## 2011-06-07 LAB — CBC
HCT: 27.7 % — ABNORMAL LOW (ref 36.0–46.0)
MCHC: 32.9 g/dL (ref 30.0–36.0)
Platelets: 219 10*3/uL (ref 150–400)
RDW: 13.9 % (ref 11.5–15.5)

## 2011-06-07 LAB — CULTURE, BLOOD (ROUTINE X 2): Culture: NO GROWTH

## 2011-06-08 LAB — CROSSMATCH
ABO/RH(D): A POS
Antibody Screen: NEGATIVE
Unit division: 0

## 2011-06-12 LAB — CULTURE, BLOOD (ROUTINE X 2): Culture: NO GROWTH

## 2011-06-14 NOTE — Consult Note (Signed)
[name], Li NO.:  [id number]  MEDICAL RECORD NO.:  [id number]  LOCATION:  1601                         FACILITY:  [facility]  PHYSICIAN:  Shirley Friar, MDDATE OF BIRTH:  1917/06/05  DATE OF CONSULTATION: DATE OF DISCHARGE:                                CONSULTATION   REFERRING PHYSICIAN:  Ramiro Harvest, MD  INDICATIONS:  Dysphagia.  HISTORY OF PRESENT ILLNESS:  [name] is a pleasant 77 year old white female with a history of dysphagia and distal esophageal ring that is status post dilation to 15 mm in June of 2008 and November 2009, both by Dr. Ewing Schlein.  She reports since her last dilation she has been eating okay although her amount of food intake has been decreased due to depressed appetite.  Approximately 4 days ago, she developed solid food dysphagia where she would feel like food becomes stuck during her meal.  She stated she would swallow several bites of food and then would get sensation of food hanging up during her meal.  She also has described some trouble swallowing liquids during this time being on a position of her head.  Prior to 4 days ago, she states that she was eating mainly processed foods that she would cut out real small and could ingest without any difficulty.  She denies any heartburn, nausea, vomiting or abdominal pain.  On upper endoscopy in November 2009, she was found to have a thin fibrous ring with distal esophagus and a tiny hiatal hernia.  She was dilated under fluoroscopy up to 15 mm and very minimal resistance without any heme was noted when 15 mm dilated.  She was admitted secondary to chills and pain in her right hip.  She states her decreased appetite has occurred since fracture of the right hip in May of this year.  PAST MEDICAL HISTORY:  Chronic back pain, gastroesophageal reflux disease, history of asthma, hypothyroidism, history of anxiety, seasonal allergies, history of migraines, status post right  hip fracture in May 2012, hypertension, status post right hip pinning, right nephrectomy.  CURRENT MEDICATIONS:  Aspirin, Rocephin, Advair, subcu heparin, Synthroid, Prinivil, multivitamin, Ditropan, Protonix.  ALLERGIES:  DEMEROL, CODEINE, FLEXERIL, FELDENE, MORPHINE.  Doses and reactions list in hospital record and were reviewed.  FAMILY HISTORY:  Noncontributory.  SOCIAL HISTORY:  Denies alcohol, drugs or tobacco.  REVIEW OF SYSTEMS:  Negative from GI standpoint except stated above.  PHYSICAL EXAMINATION:  VITAL SIGNS:  Temperature 97.5, pulse 58, blood pressure 145/50. GENERAL:  Elderly, alert, no acute distress. ABDOMEN:  Soft, tender with voluntary guarding, nondistended.  LABORATORY DATA:  White blood count 11.1, hemoglobin 9.1, platelet count 235, BUN 26, creatinine 1.48.  Other labs reviewed and listed in the hospital record.  IMPRESSION:  A 77 year old pleasant white female with history of distal esophageal ring status post dilation last in 2009.  She comes with several days of solid food dysphagia and liquid dysphagia, although liquid dysphagia depends on the positioning of her head.  I think main component of her dysphagia is esophageal dysmotility.  It could be a recurrence of her esophageal ring but each time the esophageal ring was a very thin ring with  minimal to no obstruction noted.  There is no history of heartburn.  Would recommend repeat upper endoscopy having said all that to reevaluate for any obstructive process occurring or possible dilation if there is one.  We will likely plan on balloon dilation over Savary dilation if an esophageal ring is noted this time around.  If endoscopy is unrevealing, then the patient will need to have a swallow evaluation by speech therapy.     Shirley Friar, MD     VCS/MEDQ  D:  06/02/2011  T:  06/02/2011  Job:  [id number]  cc:   Petra Kuba, M.D. Fax: [phone]  Lucky Cowboy, M.D. Fax:  [phone]  Electronically Signed by Charlott Rakes MD on 06/14/2011 02:46:07 PM

## 2011-06-14 NOTE — Op Note (Signed)
  Kelly Li, Kelly Li NO.:  [id number]  MEDICAL RECORD NO.:  [id number]  LOCATION:  1601                         FACILITY:  [facility]  PHYSICIAN:  Shirley Friar, MDDATE OF BIRTH:  May 27, 1917  DATE OF PROCEDURE: DATE OF DISCHARGE:                              OPERATIVE REPORT   PROCEDURE:  Upper endoscopy.  INDICATIONS:  Dysphagia, history of esophageal ring.  MEDICATIONS:  Fentanyl 50 mcg IV, Versed 3 mg IV, Cetacaine spray x1.  FINDINGS:  Endoscope was inserted through oropharynx and esophagus was intubated.  The proximal midportion of esophagus was unremarkable.  In the distal esophagus was a benign-appearing distal esophageal ring at 42 cm from the incisors and at the location of the Z-line.  There was mild resistance on passage of the endoscope which caused trauma to the esophageal ring with bleeding noted from passage of the endoscope.  The endoscope was advanced into the stomach which revealed a segmental area of erythema with scattered white patches in the distal stomach.  This area was also edematous.  Biopsies were taken of this area for histologic purposes.  The stomach was otherwise unremarkable. Retroflexion was done which revealed normal proximal stomach.  Endoscope was straightened and advanced into the duodenal bulb and second portion of duodenum which were unremarkable.  Endoscope was withdrawn back into the stomach and decision was made to use a TTS balloon to dilate the distal esophageal ring starting at 12 mm.  Upon inflation to 12 mm, there was evidence of space between the balloon and the esophageal ring, so the size of balloon was increased to 13.5 mm and this was held in place for 1 minute.  The balloon was deflated and the area was observed and then the blood was inflated up to 15 mm and that size was held for 1 minute.  The blood was then deflated and removed and the esophageal ring was noted to be successfully dilated.  The  endoscope was then withdrawn to confirm above findings.  ASSESSMENT: 1. Distal esophageal ring status post balloon dilation to 15 mm. 2. Distal gastritis status post biopsies for histologic purposes.  PLAN: 1. Advance diet as tolerated. 2. PPI therapy twice a day until tolerating diet and then once a day. 3. Follow up on pathology.     Shirley Friar, MD     VCS/MEDQ  D:  06/03/2011  T:  06/03/2011  Job:  [id number]  cc:   Petra Kuba, M.D. Fax: [phone]  Lucky Cowboy, M.D. Fax: [phone]  Electronically Signed by Charlott Rakes MD on 06/14/2011 02:53:49 PM

## 2011-06-17 NOTE — Discharge Summary (Signed)
Kelly Li, Kelly Li NO.:  [id number]  MEDICAL RECORD NO.:  [id number]  LOCATION:  1601                         FACILITY:  [facility]  PHYSICIAN:  Kathlen Mody, MD       DATE OF BIRTH:  1917/06/18  DATE OF ADMISSION:  06/01/2011 DATE OF DISCHARGE:  06/09/2011                              DISCHARGE SUMMARY   PRIMARY CARE PHYSICIAN:  Lucky Cowboy, MD  ORTHOPEDIST:  Ollen Gross, MD  DISCHARGE DIAGNOSES: 1. Urinary tract infection. 2. Sepsis from urinary tract infection. 3. Hypothyroidism. 4. Right femoral neck fracture nonunion, status post right hip     hemiarthroplasty. 5. Dysphagia, status post upper endoscopy, status post balloon     dilatation of the esophageal ring. 6. Asthma/chronic obstructive pulmonary disease. 7. Chronic back pain. 8. Gastroesophageal reflux disease. 9. Hypertension.  DISCHARGE MEDICATIONS: 1. Bisacodyl 5-mg tablets 2 tablets daily as needed. 2. Ciprofloxacin 500 mg twice daily for 5 days. 3. Docusate sodium 100 mg twice daily. 4. Maalox 30 mL 4 hours as needed. 5. Senna 1 tablet daily at bedtime. 6. Advair 1 puff twice daily as needed. 7. Aspirin 81 mg daily. 8. High vitamin OTC 1 capsule daily. 9. Levothyroxine 100 mcg 1 tab daily. 10.Lisinopril 20 mg daily. 11.Methocarbamol 500 mg 1 tablet q.6 h as needed. 12.Multivitamin 1 tablet daily. 13.Oxybutynin 1 tablet daily. 14.Albuterol inhaler q.4 h as needed. 15.Protonix 40 mg daily. 16.Tramadol 50 mg 4 times a day as needed. 17.Tylenol 650 mg twice daily as needed. 18.Vitamin D 1 tablet daily.  CONSULTATIONS: 1. Gastroenterology consult from Shirley Friar, MD. 2. Orthopedic consult from Ollen Gross, MD.  PROCEDURES: 1. The patient had an upper endoscopy for dysphagia, underwent distal     esophageal ring balloon dilatation. 2. The patient had a nonunion of the right femoral neck and a right     hip hemiarthroplasty.  LABORATORY DATA:  Pertinent labs from  the day of admission, the patient had a CBC on admission significant for WBC count of 10.9, hemoglobin of 10, platelets of 283.  Lactic acid was low.  Comprehensive metabolic panel was significant for a sodium of 129 and creatinine of 1.45. Urinalysis shows positive nitrites and large leukocytes with many bacteria.  Free T4 was normal.  Anemia panel showed a ferritin of 196, serum folate of 114.4, vitamin B12 of 457.  Urine sodium of 24.  Urine culture showed E Coli, which was pansensitive.  The patient had a blood culture report from June 01, 2011, from the left arm that showed E Coli and she was also pansensitive.  The rest of the blood cultures were negative so far.  Repeat blood cultures also were negative so far.  On the day of discharge, the patient had a CBC done, which showed WBC count of 10.8, hemoglobin of 9.1, platelets of 219.  Basic metabolic panel was significant for sodium of 132, glucose of 100 and creatinine of 1.  RADIOLOGY:  The patient had a right hip x-ray that showed retraction of the screws within the right proximal femur across the femoral neck fracture.  There is new virus angulation across the fracture.  Abdominal 2-view, no obstruction  or free air.  The patient had a chest x- ray, which showed COPD, no active disease, borderline heart size.  BRIEF HOSPITAL COURSE:  This is a 77 year old lady with past medical history of hypertension, hypothyroidism, anxiety, GERD, dysphagia, admitted for chills and frequent urination and also was complaining of right hip pain.  In the ER, she was found to have a urinary tract infection with possible cystitis.  She was treated for a urinary tract infection with IV ceftriaxone and urine cultures were sent, which came back growing E Coli, which was pansensitive.  One of the blood cultures also came back positive for E Coli, which was also pansensitive. Infectious Disease doctor was called for recommendations on the duration of  antibiotics and ID recommended repeat blood cultures and if they are negative, recommended a total course of 2 weeks of antibiotics preferably fluoroquinolones if she can tolerate.  The patient up until now completed 9 days of antibiotics.  She will receive 5 more days of ciprofloxacin to complete the course of antibiotics.  Right hip pain.  She was found to have a nonunion of the right hip fracture.  Dr. Lequita Halt was consulted for further evaluation.  She underwent right hemiarthroplasty.  PT/OT evaluation was requested, recommended short-term SNF versus home with home PT and OT.  At this time, the patient is awaiting bed at [facility], short-term SNF for disposition.  Anemia, most likely a combination of anemia from blood loss from the hemiarthroplasty versus presently anemia of iron deficiency.  The patient is being given IV iron every 48 hours over the last 10 days and today will be the last day of IV iron.  Dysphagia.  The patient has a long history of GERD on PPIs. Gastroenterology consult was called who recommended upper endoscopy, which showed a distal esophageal ring, underwent balloon dilatation to 15 mm of the distal esophageal ring.  The patient is able to tolerate regular diet without any symptoms at this time.  Hypothyroidism.  Her TSH and free T4 were within normal limits. Continue with her home dose of Synthroid.  Hypertension.  Blood pressure parameters are slightly high, most likely secondary to her pain from the right hip.  The patient is on lisinopril 20 mg daily.  We will continue the same.  Would not change her blood pressure medications.  We would control her right hip pain with pain medications.  COPD, stable.  Continue with her Advair and nebs as needed.  PHYSICAL EXAMINATION:  VITAL SIGNS:  On the day of discharge, the patient's vitals included a temperature of 98.3, pulse of 72, respirations 15, blood pressure 157/76, saturating 95% on room  air. GENERAL:  She is alert, afebrile, oriented x3, comfortable, in no acute distress. CARDIOVASCULAR:  S1 and S2 heard. RESPIRATORY:  Chest clear to auscultation bilaterally. ABDOMEN:  Soft, nontender, nondistended. EXTREMITIES:  No pedal edema.  FOLLOWUP:  Follow up with Dr. Oneta Rack as needed.  ACTIVITY:  Weightbearing as tolerated.  DISPOSITION:  The patient at this time is awaiting bed at [facility].          ______________________________ Kathlen Mody, MD     VA/MEDQ  D:  06/09/2011  T:  06/09/2011  Job:  [id number]  Electronically Signed by Kathlen Mody MD on 06/17/2011 02:13:53 AM

## 2011-06-18 NOTE — Op Note (Signed)
NAMECHRISTEE, Kelly Li NO.:  [id number]  MEDICAL RECORD NO.:  [id number]  LOCATION:  1601                         FACILITY:  [facility]  PHYSICIAN:  Ollen Gross, M.D.    DATE OF BIRTH:  10-Dec-1916  DATE OF PROCEDURE:  06/04/2011 DATE OF DISCHARGE:                              OPERATIVE REPORT   PREOPERATIVE DIAGNOSIS:  Nonunion, right femoral neck fracture.  POSTOPERATIVE DIAGNOSIS:  Nonunion, right femoral neck fracture.  PROCEDURE:  Right hip hemiarthroplasty.  SURGEON:  Ollen Gross, M.D.  ASSISTANT:  Alexzandrew L. Perkins, P.A.C.  ANESTHESIA:  General.  ESTIMATED BLOOD LOSS:  300.Marland Kitchen  DRAINS:  Hemovac x1.  COMPLICATIONS:  None.  CONDITION.:  Stable to recovery room.  BRIEF CLINICAL NOTE:  Ms. Yip is a 77 year old female who had right hip pinning done in early May.  She was doing extremely well and over this past weekend she started to have marked increase in pain and difficulty weightbearing.  She had been admitted to the hospital with urinary tract infection.  I have seen x-rays of her pelvis and it showed that the hardware had migrated and that the femoral neck was collapsing into varus.  I saw her today and felt as though she had a nonunion.  The hardware had failed.  We discussed options and decided to proceed with a hip hemiarthroplasty with hardware removal.  PROCEDURE IN DETAIL:  After successful administration of general anesthetic, the patient was placed in the left lateral decubitus position with the right side up and held with the hip positioner.  Right lower extremity was isolated from perineum with plastic drapes and prepped and draped in the usual sterile fashion.  Short posterolateral incision was made with a #10 blade through subcutaneous tissue to the fascia lata which was incised in line with the skin incision.  A large bursa was identified and was excised.  There was a small amount of clear fluid in the bursa.  No signs of  any infection were noted.  The screws were identified and removed.  We then internally rotated the hip and I palpated and protected the sciatic nerve.  Short external rotators and capsule were isolated off the femur.  The femur was then translated anteriorly.  Femoral head was identified and removed from the acetabulum.  Diameters was 49 mm.  We isolated the proximal femur and retractors were placed.  I resected the femoral neck at the appropriate level with an oscillating saw.  We then thoroughly irrigated the femoral canal and broached up to a size 5 for a [place] basic cemented stem.  We then placed the trial neck, a 28 + 1.5 head and the 49 mm bipolar.  The hip reduced with great stability.  There was full extension, full external rotation, 70 degrees flexion, 40 degrees adduction, 90 degrees internal rotation and 90 degrees of flexion and 70 degrees of internal rotation.  By placing right leg on top of the left, leg lengths were found to be equal.  Hip was then dislocated and trial removed.  The cement restrictor was sized to 4 and the size 4 restrictor was placed at the appropriate depth in the femoral  canal.  Canal was then irrigated with the pulsatile lavage and bottle brush.  While this was occurring, cement was mixed.  This was gentamicin impregnated cement.  Once ready for implantation, it was injected into the femoral canal.  I hand pressurized and did not use the pressurizer.  The size 4 [place] basic cemented stem was then impacted into the femoral canal at about 20 degrees of anteversion. Extruded cement was removed.  When cement had fully hardened, then the 28 +1.5 femoral head was placed as well as the 49-mm bipolar.  The hip was then reduced with same stability parameters.  Wound was copiously irrigated with saline solution and short rotators and capsule reattached to the femur through drill holes with Ethibond suture.  Fascia lata was closed over Hemovac  drain with interrupted #1 Vicryl, subcu closed with #1 and #2-0 Vicryl and subcuticular running 4-0 Monocryl.  Drain hooked to suction, incision cleaned and dried, and a bulky sterile dressing applied.  She was placed into a knee immobilizer, awakened, and transported to recovery in stable condition.     Ollen Gross, M.D.     FA/MEDQ  D:  06/04/2011  T:  06/04/2011  Job:  [id number]  Electronically Signed by Ollen Gross M.D. on 06/18/2011 12:33:24 PM

## 2011-07-01 ENCOUNTER — Ambulatory Visit (HOSPITAL_COMMUNITY)
Admission: RE | Admit: 2011-07-01 | Discharge: 2011-07-01 | Disposition: A | Discharge status: Home / Self Care | Payer: Medicare Other | Source: Ambulatory Visit | Attending: Internal Medicine | Admitting: Internal Medicine

## 2011-07-01 DIAGNOSIS — M7989 Other specified soft tissue disorders: Secondary | ICD-10-CM

## 2012-09-28 ENCOUNTER — Encounter (HOSPITAL_COMMUNITY): Payer: Self-pay

## 2012-09-28 ENCOUNTER — Emergency Department (HOSPITAL_COMMUNITY)
Admission: EM | Admit: 2012-09-28 | Discharge: 2012-09-28 | Disposition: A | Discharge status: Home / Self Care | Payer: Medicare Other | Attending: Emergency Medicine | Admitting: Emergency Medicine

## 2012-09-28 ENCOUNTER — Emergency Department (HOSPITAL_COMMUNITY): Payer: Medicare Other

## 2012-09-28 DIAGNOSIS — Z8639 Personal history of other endocrine, nutritional and metabolic disease: Secondary | ICD-10-CM | POA: Insufficient documentation

## 2012-09-28 DIAGNOSIS — Z862 Personal history of diseases of the blood and blood-forming organs and certain disorders involving the immune mechanism: Secondary | ICD-10-CM | POA: Insufficient documentation

## 2012-09-28 DIAGNOSIS — R05 Cough: Secondary | ICD-10-CM | POA: Insufficient documentation

## 2012-09-28 DIAGNOSIS — K219 Gastro-esophageal reflux disease without esophagitis: Secondary | ICD-10-CM | POA: Insufficient documentation

## 2012-09-28 DIAGNOSIS — R5381 Other malaise: Secondary | ICD-10-CM | POA: Insufficient documentation

## 2012-09-28 DIAGNOSIS — R079 Chest pain, unspecified: Secondary | ICD-10-CM | POA: Insufficient documentation

## 2012-09-28 DIAGNOSIS — J45909 Unspecified asthma, uncomplicated: Secondary | ICD-10-CM | POA: Insufficient documentation

## 2012-09-28 DIAGNOSIS — R059 Cough, unspecified: Secondary | ICD-10-CM | POA: Insufficient documentation

## 2012-09-28 HISTORY — DX: Unspecified asthma, uncomplicated: J45.909

## 2012-09-28 LAB — TROPONIN I: Troponin I: <0.3 ng/mL (ref <0.30)

## 2012-09-28 LAB — CBC WITH DIFFERENTIAL/PLATELET
Basophils Absolute: 0 10*3/uL (ref 0.0–0.1)
Basophils Relative: 0 % (ref 0–1)
Hemoglobin: 11.4 g/dL — ABNORMAL LOW (ref 12.0–15.0)
MCHC: 32.9 g/dL (ref 30.0–36.0)
Neutro Abs: 3.8 10*3/uL (ref 1.7–7.7)
Neutrophils Relative %: 54 % (ref 43–77)
RDW: 12.7 % (ref 11.5–15.5)

## 2012-09-28 LAB — BASIC METABOLIC PANEL
Chloride: 99 mEq/L (ref 96–112)
GFR calc Af Amer: 33 mL/min — ABNORMAL LOW (ref >90)
Potassium: 4.1 mEq/L (ref 3.5–5.1)

## 2012-09-28 NOTE — ED Provider Notes (Signed)
History     CSN: [id number]  Arrival date & time 09/28/12  1730   First MD Initiated Contact with Patient 09/28/12 1755      Chief Complaint  Patient presents with  . Cough  . Aspiration  . Weakness  . Chest Pain    (Consider location/radiation/quality/duration/timing/severity/associated sxs/prior treatment) HPIViola CONTINA Li is a 78 y.o. female presenting as referral from [facility] office with cough since last Thursday.  Wednesday night, she had an episode of reflux while laying down to sleep and immediately thereafter had a burning sensation in her throat and has had a persistent cough.  She had a second episode which was worse.  Cough has been productive of white sputum, no hemoptysis or hematemesis.  She denies SOB or chest pain.  Has complained of frontal pressure headache with severe coughing.  Denies fever, chills, paresthesias, rash, focal or global weakness.  She presents with her 2 concerned daughters and son.  Past Medical History  Diagnosis Date  . Asthma   . Thyroid disease     Past Surgical History  Procedure Date  . Hip surgery   . Thyroid surgery     No family history on file.  History  Substance Use Topics  . Smoking status: Never Smoker   . Smokeless tobacco: Not on file  . Alcohol Use: No    OB History    Grav Para Term Preterm Abortions TAB SAB Ect Mult Living                  Review of Systems At least 10pt or greater review of systems completed and are negative except where specified in the HPI.  Allergies  Codeine  Home Medications  No current outpatient prescriptions on file.  BP 176/67  Pulse 68  Temp 97.4 F (36.3 C) (Oral)  Resp 21  Ht 5\' 6"  (1.676 m)  Wt 120 lb (54.432 kg)  BMI 19.37 kg/m2  SpO2 99%  Physical Exam  Nursing notes reviewed.  Electronic medical record reviewed. VITAL SIGNS:   Filed Vitals:   09/28/12 1738 09/28/12 1757  BP: 165/88 176/67  Pulse: 63 68  Temp: 98.9 F (37.2 C) 97.4 F (36.3 C)  TempSrc:  Oral Oral  Resp: 18 21  Height: 5\' 6"  (1.676 m)   Weight: 120 lb (54.432 kg)   SpO2: 100% 99%   CONSTITUTIONAL: Awake, oriented, appears non-toxic HENT: Atraumatic, normocephalic, oral mucosa pink and moist, airway patent. Nares patent without drainage. External ears normal. EYES: Conjunctiva injected, EOMI, PERRLA NECK: Trachea midline, non-tender, supple CARDIOVASCULAR: Normal heart rate, Normal rhythm, No murmurs, rubs, gallops PULMONARY/CHEST: Large airway secretion auscultated - lung fields clear.  No wheezes, consolidation, rales. Symmetrical breath sounds. Non-tender. ABDOMINAL: Non-distended, soft, non-tender - no rebound or guarding.  BS normal. NEUROLOGIC: Non-focal, moving all four extremities, no gross sensory or motor deficits. EXTREMITIES: No clubbing, cyanosis, or edema SKIN: Warm, Dry, No erythema, No rash.  Multiple actinic keratoses  ED Course  Procedures (including critical care time)  Labs Reviewed  CBC WITH DIFFERENTIAL - Abnormal; Notable for the following:    RBC 3.71 (*)     Hemoglobin 11.4 (*)     HCT 34.6 (*)     All other components within normal limits  BASIC METABOLIC PANEL - Abnormal; Notable for the following:    Sodium 133 (*)     BUN 32 (*)     Creatinine, Ser 1.53 (*)     GFR calc non Af [name]  28 (*)     GFR calc Af Amer 33 (*)     All other components within normal limits  TROPONIN I  LAB REPORT - SCANNED   Dg Chest 2 View  09/28/2012  *RADIOLOGY REPORT*  Clinical Data: Weakness.  Cough.  Chest pain.  CHEST - 2 VIEW  Comparison: 06/01/2011  Findings: Cardiomegaly noted with mild tortuosity of the thoracic aorta and atherosclerotic calcification of the aortic arch.  There is a band of atelectasis or fluid along the right major fissure, with slight blunting of the right lateral costophrenic angle.  Mild thoracic spondylosis noted.  No overt edema.  IMPRESSION:  1.  Cardiomegaly, without edema. 2.  Atelectasis or fluid along the right major  fissure, with minimal blunting of the right lateral costophrenic angle.   Original Report Authenticated By: Dellia Cloud, M.D.      1. GERD (gastroesophageal reflux disease)   2. Cough       MDM  Kelly Li is a 78 y.o. female presenting with possible aspiration event last week.  CP related likely to cough - doubt ACS/anginal equivalent.  No dyspnea.  Concern for aspiration pneumonitis vs aspiration pneumonia - but she looks very well, I would expect her to have an ill appearance.  No physical exam findings concerning for pneumomediastinum, PNA.  Labs unremarkable.  CXR obtained reviewed and discussed with radiologist.  Aspiration days ago would show something by this time.  Mild blunting of Right CP angle and small amount of fluid or atelectasis along right major fissure.  WBC unremarkable.  Pt has been taking ppi at double dosage, but taking all at once in the am - changed her dosage schedule to BID, AM and QHS.  Hard candies or OTC sore throat lozenges for cough.  OTC acid reducers for occasional discomfort.  Referred back to PCP in 2-3 days to assess progress.  I explained the diagnosis and have given explicit precautions to return to the ER including dyspnea, chest pain, fever or any other new or worsening symptoms. The patient understands and accepts the medical plan as it's been dictated and I have answered their questions. Discharge instructions concerning home care and prescriptions have been given.  The patient is STABLE and is discharged to home in good condition.         Jones Skene, MD 09/29/12 1647

## 2012-09-28 NOTE — ED Notes (Signed)
MD at bedside. Bunk

## 2012-09-28 NOTE — ED Notes (Signed)
Pt states that this past Wednesday night, she was woken up by having acid reflux.  Pt states that since she was lying down, she aspirated on what has come up.  Pt states that she has gradually felt worse since.  Has a productive cough (white sputum).  Pt went to her PCP and was told to come here to be evaluated for aspiration pneumonia.

## 2012-09-28 NOTE — ED Notes (Signed)
Pt presents with NAD- daughter reports pt has acid reflux and aspirated last Wednesday night- Pt has not felt well since.  Pt seen by PCP this afternoon- recommended to be evaluated here for aspiration pneumonia.  Pt c/o of chest tightness while in triage

## 2013-07-15 ENCOUNTER — Ambulatory Visit
Admission: RE | Admit: 2013-07-15 | Discharge: 2013-07-15 | Disposition: A | Discharge status: Home / Self Care | Payer: Medicare Other | Source: Ambulatory Visit | Attending: Internal Medicine | Admitting: Internal Medicine

## 2013-07-15 ENCOUNTER — Other Ambulatory Visit: Payer: Self-pay | Admitting: Internal Medicine

## 2013-07-15 DIAGNOSIS — R109 Unspecified abdominal pain: Secondary | ICD-10-CM

## 2013-10-06 ENCOUNTER — Encounter: Payer: Self-pay | Admitting: Internal Medicine

## 2013-10-14 ENCOUNTER — Ambulatory Visit: Payer: Medicare Other | Admitting: Internal Medicine

## 2013-10-14 VITALS — BP 158/70 | HR 60 | Temp 98.8°F | Resp 18 | Ht 65.0 in | Wt 134.4 lb

## 2013-10-14 DIAGNOSIS — I10 Essential (primary) hypertension: Secondary | ICD-10-CM

## 2013-10-14 DIAGNOSIS — N3000 Acute cystitis without hematuria: Secondary | ICD-10-CM

## 2013-10-14 DIAGNOSIS — Z79899 Other long term (current) drug therapy: Secondary | ICD-10-CM

## 2013-10-14 DIAGNOSIS — R1031 Right lower quadrant pain: Secondary | ICD-10-CM

## 2013-10-14 DIAGNOSIS — R7309 Other abnormal glucose: Secondary | ICD-10-CM

## 2013-10-14 DIAGNOSIS — E782 Mixed hyperlipidemia: Secondary | ICD-10-CM

## 2013-10-14 DIAGNOSIS — E559 Vitamin D deficiency, unspecified: Secondary | ICD-10-CM

## 2013-10-14 LAB — BASIC METABOLIC PANEL WITH GFR
BUN: 27 mg/dL — ABNORMAL HIGH (ref 6–23)
Chloride: 104 mEq/L (ref 96–112)
Creat: 1.4 mg/dL — ABNORMAL HIGH (ref 0.50–1.10)
Glucose, Bld: 95 mg/dL (ref 70–99)
Potassium: 4.6 mEq/L (ref 3.5–5.3)

## 2013-10-14 LAB — HEPATIC FUNCTION PANEL
AST: 12 U/L (ref 0–37)
Albumin: 3.6 g/dL (ref 3.5–5.2)
Bilirubin, Direct: 0.1 mg/dL (ref 0.0–0.3)
Total Bilirubin: 0.5 mg/dL (ref 0.3–1.2)

## 2013-10-14 LAB — MAGNESIUM: Magnesium: 1.9 mg/dL (ref 1.5–2.5)

## 2013-10-14 LAB — CBC WITH DIFFERENTIAL/PLATELET
Basophils Absolute: 0 10*3/uL (ref 0.0–0.1)
HCT: 33.8 % — ABNORMAL LOW (ref 36.0–46.0)
Hemoglobin: 11.3 g/dL — ABNORMAL LOW (ref 12.0–15.0)
Lymphocytes Relative: 40 % (ref 12–46)
Lymphs Abs: 2.1 10*3/uL (ref 0.7–4.0)
Monocytes Absolute: 0.6 10*3/uL (ref 0.1–1.0)
Neutro Abs: 2.5 10*3/uL (ref 1.7–7.7)
RBC: 3.76 MIL/uL — ABNORMAL LOW (ref 3.87–5.11)
RDW: 13.9 % (ref 11.5–15.5)
WBC: 5.3 10*3/uL (ref 4.0–10.5)

## 2013-10-14 LAB — LIPID PANEL
HDL: 42 mg/dL (ref >39)
LDL Cholesterol: 75 mg/dL (ref 0–99)
Total CHOL/HDL Ratio: 3.4 Ratio
VLDL: 27 mg/dL (ref 0–40)

## 2013-10-14 LAB — HEMOGLOBIN A1C: Hgb A1c MFr Bld: 5.2 % (ref <5.7)

## 2013-10-14 MED ORDER — BENAZEPRIL HCL 20 MG PO TABS: 20.0000 mg | ORAL_TABLET | Freq: Every day | ORAL | Status: DC

## 2013-10-14 NOTE — Patient Instructions (Signed)
Continue medicines same pending lab results and results of urine culture  Urinary Tract Infection Urinary tract infections (UTIs) can develop anywhere along your urinary tract. Your urinary tract is your body's drainage system for removing wastes and extra water. Your urinary tract includes two kidneys, two ureters, a bladder, and a urethra. Your kidneys are a pair of bean-shaped organs. Each kidney is about the size of your fist. They are located below your ribs, one on each side of your spine. CAUSES Infections are caused by microbes, which are microscopic organisms, including fungi, viruses, and bacteria. These organisms are so small that they can only be seen through a microscope. Bacteria are the microbes that most commonly cause UTIs. SYMPTOMS  Symptoms of UTIs may vary by age and gender of the patient and by the location of the infection. Symptoms in young women typically include a frequent and intense urge to urinate and a painful, burning feeling in the bladder or urethra during urination. Older women and men are more likely to be tired, shaky, and weak and have muscle aches and abdominal pain. A fever may mean the infection is in your kidneys. Other symptoms of a kidney infection include pain in your back or sides below the ribs, nausea, and vomiting. DIAGNOSIS To diagnose a UTI, your caregiver will ask you about your symptoms. Your caregiver also will ask to provide a urine sample. The urine sample will be tested for bacteria and white blood cells. White blood cells are made by your body to help fight infection. TREATMENT  Typically, UTIs can be treated with medication. Because most UTIs are caused by a bacterial infection, they usually can be treated with the use of antibiotics. The choice of antibiotic and length of treatment depend on your symptoms and the type of bacteria causing your infection. HOME CARE INSTRUCTIONS  If you were prescribed antibiotics, take them exactly as your  caregiver instructs you. Finish the medication even if you feel better after you have only taken some of the medication.  Drink enough water and fluids to keep your urine clear or pale yellow.  Avoid caffeine, tea, and carbonated beverages. They tend to irritate your bladder.  Empty your bladder often. Avoid holding urine for long periods of time.  Empty your bladder before and after sexual intercourse.  After a bowel movement, women should cleanse from front to back. Use each tissue only once. SEEK MEDICAL CARE IF:   You have back pain.  You develop a fever.  Your symptoms do not begin to resolve within 3 days. SEEK IMMEDIATE MEDICAL CARE IF:   You have severe back pain or lower abdominal pain.  You develop chills.  You have nausea or vomiting.  You have continued burning or discomfort with urination. MAKE SURE YOU:   Understand these instructions.  Will watch your condition.  Will get help right away if you are not doing well or get worse. Document Released: 09/03/2005 Document Revised: 05/25/2012 Document Reviewed: 01/02/2012 [facility] Patient Information 2014 , [place].   Hypertension As your heart beats, it forces blood through your arteries. This force is your blood pressure. If the pressure is too high, it is called hypertension (HTN) or high blood pressure. HTN is dangerous because you may have it and not know it. High blood pressure may mean that your heart has to work harder to pump blood. Your arteries may be narrow or stiff. The extra work puts you at risk for heart disease, stroke, and other problems.  Blood  pressure consists of two numbers, a higher number over a lower, 110/72, for example. It is stated as "110 over 72." The ideal is below 120 for the top number (systolic) and under 80 for the bottom (diastolic). Write down your blood pressure today. You should pay close attention to your blood pressure if you have certain conditions such as:  Heart  failure.  Prior heart attack.  Diabetes  Chronic kidney disease.  Prior stroke.  Multiple risk factors for heart disease. To see if you have HTN, your blood pressure should be measured while you are seated with your arm held at the level of the heart. It should be measured at least twice. A one-time elevated blood pressure reading (especially in the Emergency Department) does not mean that you need treatment. There may be conditions in which the blood pressure is different between your right and left arms. It is important to see your caregiver soon for a recheck. Most people have essential hypertension which means that there is not a specific cause. This type of high blood pressure may be lowered by changing lifestyle factors such as:  Stress.  Smoking.  Lack of exercise.  Excessive weight.  Drug/tobacco/alcohol use.  Eating less salt. Most people do not have symptoms from high blood pressure until it has caused damage to the body. Effective treatment can often prevent, delay or reduce that damage. TREATMENT  When a cause has been identified, treatment for high blood pressure is directed at the cause. There are a large number of medications to treat HTN. These fall into several categories, and your caregiver will help you select the medicines that are best for you. Medications may have side effects. You should review side effects with your caregiver. If your blood pressure stays high after you have made lifestyle changes or started on medicines,   Your medication(s) may need to be changed.  Other problems may need to be addressed.  Be certain you understand your prescriptions, and know how and when to take your medicine.  Be sure to follow up with your caregiver within the time frame advised (usually within two weeks) to have your blood pressure rechecked and to review your medications.  If you are taking more than one medicine to lower your blood pressure, make sure you know  how and at what times they should be taken. Taking two medicines at the same time can result in blood pressure that is too low. SEEK IMMEDIATE MEDICAL CARE IF:  You develop a severe headache, blurred or changing vision, or confusion.  You have unusual weakness or numbness, or a faint feeling.  You have severe chest or abdominal pain, vomiting, or breathing problems. MAKE SURE YOU:   Understand these instructions.  Will watch your condition.  Will get help right away if you are not doing well or get worse. Document Released: 11/24/2005 Document Revised: 02/16/2012 Document Reviewed: 07/14/2008 [facility] Patient Information 2014 [place], [place].

## 2013-10-14 NOTE — Progress Notes (Deleted)
Patient ID: Kelly Li, female   DOB: 1917-11-02, 79 y.o.   MRN: [id number] HPI: Patient presents for 3 month follow up with hypertension, hyperlipidemia, pre-diabetes and vitamin D deficiency.   BP has been controlled at home. Today's BP is    . Patient denies any cardiac type chest pain, palpitations, dyspnea/orthopnea/PND, dizziness, claudication, or dependent edema.  Hyperlipidemia is controlled with diet & meds. Patient denies myalgias or other med SE's. Last cholesterol was    , trig    , and LDL  Also, patient has history of prediabetes/insulin resistance with last A1c of    . Patient denies any visual blurring, diabetic polys or paresthesias.  Further, Patient has history of vitamin D deficiency with pt supplementing same    {*** HELP TEXT ***  This SmartLink requires parameters for processing. Parameters are variables that can be added to the original SmartLink name. This allows the user to request specific information by giving the SmartLink precise direction.  The LabLast SmartLink accepts a list of result component base names separated by commas. The user can also request the number of results to display for each component. To indicate the number of results for each component, type the component name followed by a colon and then the number of results. (Component name:4). For all results for that particular component, type a star in place of the number (Component name:*). To obtain the last result for a particular component, type the component base name without the colon, number, or star. Ex: .LabLast[RBC. Depending on how the SmartLink is setup, results may be limited by a lookback period. In this case, any results older than the lookback period will not be displayed.  The SmartLink will display the name of the component(s) with the units used next to the name followed by a table listing the date and the result value for each result requested.  Example: .LabLast[MCH:3,MCV:*,HCT    This example would display a table for all components whose base name matches '[facility]', 'MCV', or 'HCT'. The first would contain the last three results of each component that has a base name of MCH, the second would contain all the entered results for components with MCV as the base name, and the third would contain the last entered result for all components with a base name of HCT.} Current outpatient prescriptions:albuterol (PROVENTIL HFA;VENTOLIN HFA) 108 (90 BASE) MCG/ACT inhaler, Inhale 2 puffs into the lungs every 6 (six) hours as needed. Shortness of breath, Disp: , Rfl: ;  aspirin EC 81 MG tablet, Take 81 mg by mouth daily., Disp: , Rfl: ;  bimatoprost (LUMIGAN) 0.01 % SOLN, Place 1 drop into both eyes at bedtime., Disp: , Rfl:  cholecalciferol (VITAMIN D) 1000 UNITS tablet, Take 2,000 Units by mouth daily., Disp: , Rfl: ;  Fluticasone-Salmeterol (ADVAIR) 100-50 MCG/DOSE AEPB, Inhale 1 puff into the lungs every 12 (twelve) hours., Disp: , Rfl: ;  levothyroxine (SYNTHROID, LEVOTHROID) 100 MCG tablet, Take 50-100 mcg by mouth daily. Takes 1/2 tablet on tuesdays and thursdays,,,,every other day takes 1 tablet daily, Disp: , Rfl:  loteprednol (LOTEMAX) 0.2 % SUSP, Place 1 drop into both eyes 4 (four) times daily., Disp: , Rfl: ;  mometasone (NASONEX) 50 MCG/ACT nasal spray, Place 2 sprays into the nose daily as needed. allergies, Disp: , Rfl: ;  Multiple Vitamins-Minerals (ICAPS) TABS, Take 1 tablet by mouth daily., Disp: , Rfl: ;  pantoprazole (PROTONIX) 40 MG tablet, Take 40 mg by mouth 2 (two) times daily., Disp: ,  Rfl:  vitamin B-12 (CYANOCOBALAMIN) 500 MCG tablet, Take 500 mcg by mouth daily., Disp: , Rfl: ;  benazepril (LOTENSIN) 20 MG tablet, Take 1 tablet (20 mg total) by mouth daily. At BEDTIME for BP, Disp: 90 tablet, Rfl: 11  Codeine; Morphine and related; and Nsaids Systems Review: Constitutional: Denies fever, chills, wt changes, headaches, insomnia, fatigue, night sweats, change in  appetite. Eyes: Denies redness, blurred vision, diplopia, discharge, itchy, watery eyes.  ENT: Denies discharge, congestion, post nasal drip, epistaxis, sore throat, earache, hearing loss, dental pain, Tinnitus, Vertigo, Sinus pain, snoring.  CV: Denies chest pain, palpitations, irregular heartbeat, syncope, dyspnea, diaphoresis, orthopnea, PND, claudication, edema Respiratory: denies cough, dyspnea, DOE, pleurisy, hoarseness, laryngitis, wheezing.  Gastrointestinal: Denies dysphagia, odynophagia, heartburn, reflux, water brash, pain, cramps, nausea, vomiting, bloating, diarrhea, constipation, hematemesis, melena, hematochezia, jaundice, hemorrhoids Genitourinary: Denies dysuria, frequency, urgency, nocturia, hesitancy, discharge, hematuria, flank pain Musculoskeletal: Denies arthralgias, myalgias, stiffness, Jt. Swelling, pain, limp, strain/sprain.  Skin: Denies pruritus, rash, hives, warts, acne, eczema, change in skin lesion(s) Neuro: No weakness, tremor, incoordination, spasms, paresthesia, pain Psychiatric: Denies confusion, memory loss, sensory loss Endo: Denies change in weight, skin, hair change, nocturia, diabetic polys, paresthesias, visual blurring, hyper/hypo-glycemic episodes.  Heme/Lymph: Excessive bleeding, bruising, enlarged lymph nodes PMHx: FHx: Reviewed / unchanged SHx: Reviewed / unchanged Filed Vitals:   10/14/13 1431  BP: 158/70  Pulse: 60  Temp: 98.8 F (37.1 C)  Resp: 18    On Exam: Appears well nourished - in NAD. Eyes: PERRLA, EOMs, conjunctiva no swelling or erythema, normal fundi and vessels. Sinuses: No Frontal/maxillary tenderness ENT/Mouth: EAC's clear, TM's nl w/o erythema, bulging. Nares clear w/o erythema, swelling, exudates. Oropharynx clear. No ulcers, cracking, on lips. Dentition - unremarkable. No erythema. Tongue normal, non obstructing. Hearing intact.  Neck: Supple. Car 2+/2+. Thyroid nl. No LN, bruits or JVD. Chest: Respirations nl, clear   equal BS w/o  rales, rhonchi, wheezing or stridor.  Cardio: Heart sounds normal w/ regular rate and rhythm without sig. murmurs, gallops,clicks, or rubs. Peripheral pulses normal and equal  without edema.  Abdomen: Soft, flat & bowel sounds normal. Non-tender w/o guarding, rebound, hernias, masses, or organomegaly.  Lymphatics: Non tender without lymphadenopathy.  Musculoskeletal: Full ROM all peripheral extremities, joint stability, 5/5 strength, and normal gait.  Skin: Warm, dry without rashes, lesions, ecchymosis.  Neuro: Cranial nerves intact, reflexes equal bilaterally. Sensory-motor testing grossly intact. Tendon reflexes grossly intact.  Pysch: Alert & oriented x 3. Insight and judgement nl & appropriate. No ideations.  Assessment and Plan:  1. Hypertension - Continue medication, monitor blood pressure at home. Continue diet/meds.  2. Hyperlipidemia - Continue diet/meds, exercise,& lifestyle modifications. Continue monitor cholesterol/LFT's.   3. Pre-diabetes/Insulin Resistance - Continue diet, exercise, lifestyle modifications. Monitor appropriate labs.   4. Vitamin D Deficiency - Continue supplementation

## 2013-10-14 NOTE — Progress Notes (Signed)
Patient ID: Kelly Li, female   DOB: [birth year], 79 y.o.   MRN: [id number]  HPI:79 yo [facility] presents for 3 month follow up with hypertension, hyperlipidemia, pre-diabetes Hx/o recurrent UTI's and vitamin D deficiency.   BP has been controlled at home. Today's BP is high at 158/70 and confirmed 167/76 on recheck. Patient denies any cardiac type chest pain, palpitations, dyspnea/orthopnea/PND, claudication, or dependent edema.She does report ossasional frontal HA's.  Hyperlipidemia is controlled with diet. Last cholesterol was 152, HDL 50, LDL 87 @ goal in August.  Also, patient has history of abnormal glucose with last A1c of 5.3 % in may 2014. Patient denies any visual blurring, diabetic polys or paresthesias.  Further, Patient has history of vitamin D deficiency - last Vit D 82 in August with pt supplementing same.  Other problems include recurrent UTI's with + E.coli UTI in August Tx'd with Cipro and negative F/U culture in September. She has occasional Sx's of mild dysuria.  Finally, she reports a sudden tearing pain in her right groin approx. A month ago while arising from a chair. The pain persist, alto is less intenese, but it is positional in nature.     Current outpatient prescriptions:albuterol (PROVENTIL HFA;VENTOLIN HFA) 108 (90 BASE) MCG/ACT inhaler, Inhale 2 puffs into the lungs every 6 (six) hours as needed. Shortness of breath, Disp: , Rfl: ;  aspirin EC 81 MG tablet, Take 81 mg by mouth daily., Disp: , Rfl: ;  bimatoprost (LUMIGAN) 0.01 % SOLN, Place 1 drop into both eyes at bedtime., Disp: , Rfl:  cholecalciferol (VITAMIN D) 1000 UNITS tablet, Take 2,000 Units by mouth daily., Disp: , Rfl: ;  Fluticasone-Salmeterol (ADVAIR) 100-50 MCG/DOSE AEPB, Inhale 1 puff into the lungs every 12 (twelve) hours., Disp: , Rfl: ;  levothyroxine (SYNTHROID, LEVOTHROID) 100 MCG tablet, Take 50-100 mcg by mouth daily. Takes 1/2 tablet on tuesdays and thursdays,,,,every other day takes 1 tablet  daily, Disp: , Rfl:  loteprednol (LOTEMAX) 0.2 % SUSP, Place 1 drop into both eyes 4 (four) times daily., Disp: , Rfl: ;  mometasone (NASONEX) 50 MCG/ACT nasal spray, Place 2 sprays into the nose daily as needed. allergies, Disp: , Rfl: ;  Multiple Vitamins-Minerals (ICAPS) TABS, Take 1 tablet by mouth daily., Disp: , Rfl: ;  pantoprazole (PROTONIX) 40 MG tablet, Take 40 mg by mouth 2 (two) times daily., Disp: , Rfl:  vitamin B-12 (CYANOCOBALAMIN) 500 MCG tablet, Take 500 mcg by mouth daily., Disp: , Rfl: ;  benazepril (LOTENSIN) 20 MG tablet, Take 1 tablet (20 mg total) by mouth daily. At BEDTIME for BP, Disp: 90 tablet, Rfl: 11  Allergies  Allergen Reactions  . Codeine Anaphylaxis    unknown  . Morphine And Related Nausea And Vomiting  . Nsaids     Systems Review: Constitutional: Denies fever, chills, wt changes, headaches, insomnia, fatigue, night sweats, change in appetite. Eyes: Denies redness, blurred vision, diplopia, discharge, itchy, watery eyes.  ENT: Denies discharge, congestion, post nasal drip, epistaxis, sore throat, earache, hearing loss, dental pain, Tinnitus, Vertigo, Sinus pain, snoring.  CV: Denies chest pain, palpitations, irregular heartbeat, syncope, dyspnea, diaphoresis, orthopnea, PND, claudication, edema Respiratory: denies cough, dyspnea, DOE, pleurisy, hoarseness, laryngitis, wheezing.  Gastrointestinal: Denies dysphagia, odynophagia, heartburn, reflux, water brash, pain, cramps, nausea, vomiting, bloating, diarrhea, constipation, hematemesis, melena, hematochezia, jaundice, hemorrhoids Genitourinary: Denies frequency, urgency, nocturia, hesitancy, discharge, hematuria, flank pain. Occas mild  Dysuria. Musculoskeletal: Denies arthralgias, myalgias, stiffness, Jt. Swelling, pain, limp, strain/sprain.  Skin: Denies pruritus, rash, hives, warts,  acne, eczema, change in skin lesion(s) Neuro: No weakness, tremor, incoordination, spasms, paresthesia, pain Psychiatric:  Denies confusion, memory loss, sensory loss Endo: Denies change in weight, skin, hair change, nocturia, diabetic polys, paresthesias, visual blurring, hyper/hypo-glycemic episodes.  Heme/Lymph: Excessive bleeding, bruising, enlarged lymph nodes  PMHx: FHx: Reviewed / unchanged SHx: Reviewed / unchanged Filed Vitals:   10/14/13 1431  BP: 158/70  Pulse: 60  Temp: 98.8 F (37.1 C)  Resp: 18   Estimated body mass index is 22.37 kg/(m^2) as calculated from the following:   Height as of this encounter: 5\' 5"  (1.651 m).   Weight as of this encounter: 134 lb 6.4 oz (60.963 kg).  On Exam: Appears well nourished - in NAD. Eyes: PERRLA, EOMs, conjunctiva no swelling or erythema, normal fundi and vessels. Sinuses: No Frontal/maxillary tenderness ENT/Mouth: EAC's clear, TM's nl w/o erythema, bulging. Nares clear w/o erythema, swelling, exudates. Oropharynx clear. No ulcers, cracking, on lips. Dentition - unremarkable. No erythema. Tongue normal, non obstructing. Hearing intact.  Neck: Supple. Car 2+/2+. Thyroid nl. No LN, bruits or JVD. Chest: Respirations nl, clear  equal BS w/o  rales, rhonchi, wheezing or stridor.  Cardio: Heart sounds normal w/ regular rate and rhythm without sig. murmurs, gallops,clicks, or rubs. Peripheral pulses normal and equal  without edema.  Abdomen: Soft, flat & bowel sounds normal. Slight tender @ Right inguinal line w/o palpable masses or guarding, rebound, hernias, masses, or organomegaly.  Lymphatics: Non tender without lymphadenopathy.  Musculoskeletal: Full ROM all peripheral extremities, joint stability, 5/5 strength, and normal gait.  Skin: Warm, dry without rashes, lesions, ecchymosis.  Neuro: Cranial nerves intact, reflexes equal bilaterally. Sensory-motor testing grossly intact. Tendon reflexes grossly intact.  Pysch: Alert & oriented x 3. Insight and judgement nl & appropriate. No ideations.  Assessment and Plan:  1. Hypertension - Start Lotensin 20 mg,  monitor blood pressure at home - call if BP remains elevated. Continue diet/meds.  2. Hyperlipidemia - Continue diet, exercise,& lifestyle modifications. Continue monitor cholesterol.   3. Abnormal Glucose, Hx/o - Continue diet, exercise, lifestyle modifications. Monitor appropriate labs.  4. Vitamin D Deficiency - Continue supplementation  5. UTI's, recurrent - recheck U/C  6. Rt. Lower Abdominal Wall Strain

## 2013-10-15 LAB — URINALYSIS, COMPLETE
Bacteria, UA: NONE SEEN
Crystals: NONE SEEN
Ketones, ur: NEGATIVE mg/dL
Nitrite: NEGATIVE
Specific Gravity, Urine: 1.007 (ref 1.005–1.030)
Urobilinogen, UA: 0.2 mg/dL (ref 0.0–1.0)

## 2013-10-15 LAB — VITAMIN D 25 HYDROXY (VIT D DEFICIENCY, FRACTURES): Vit D, 25-Hydroxy: 65 ng/mL (ref 30–89)

## 2013-10-15 LAB — URINE CULTURE: Colony Count: 6000

## 2013-10-17 ENCOUNTER — Telehealth: Payer: Self-pay | Admitting: *Deleted

## 2013-10-17 NOTE — Telephone Encounter (Signed)
Message copied by Reggy Eye on Mon Oct 17, 2013  9:01 AM ------      Message from: Lucky Cowboy      Created: Sun Oct 16, 2013  6:35 PM       Kidney functions stable & OK - Ur culture is neg - no infection - vit D level is good - all else nl & OK ------

## 2013-10-17 NOTE — Telephone Encounter (Signed)
Pt aware of results 

## 2013-10-31 ENCOUNTER — Other Ambulatory Visit: Payer: Self-pay | Admitting: Physician Assistant

## 2013-10-31 MED ORDER — ALBUTEROL SULFATE HFA 108 (90 BASE) MCG/ACT IN AERS: 2.0000 | INHALATION_SPRAY | Freq: Four times a day (QID) | RESPIRATORY_TRACT | Status: DC | PRN

## 2013-11-16 ENCOUNTER — Telehealth: Payer: Self-pay | Admitting: *Deleted

## 2013-11-16 NOTE — Telephone Encounter (Signed)
[name], daughter, called. States pt her throat felt like it was closing after she swallowed her med this am, but daughter states not in distress.  Per Dr Oneta Rack, pt should go to ED if chokong or in distress.  Dois Davenport aware.

## 2014-03-16 ENCOUNTER — Ambulatory Visit (INDEPENDENT_AMBULATORY_CARE_PROVIDER_SITE_OTHER): Payer: Medicare Other | Admitting: Emergency Medicine

## 2014-03-16 ENCOUNTER — Encounter: Payer: Self-pay | Admitting: Emergency Medicine

## 2014-03-16 VITALS — BP 158/66 | HR 62 | Temp 98.2°F | Resp 16 | Ht 65.5 in | Wt 136.0 lb

## 2014-03-16 DIAGNOSIS — E559 Vitamin D deficiency, unspecified: Secondary | ICD-10-CM

## 2014-03-16 DIAGNOSIS — R5383 Other fatigue: Secondary | ICD-10-CM

## 2014-03-16 DIAGNOSIS — R2689 Other abnormalities of gait and mobility: Secondary | ICD-10-CM

## 2014-03-16 DIAGNOSIS — R030 Elevated blood-pressure reading, without diagnosis of hypertension: Secondary | ICD-10-CM

## 2014-03-16 DIAGNOSIS — K219 Gastro-esophageal reflux disease without esophagitis: Secondary | ICD-10-CM

## 2014-03-16 DIAGNOSIS — I10 Essential (primary) hypertension: Secondary | ICD-10-CM

## 2014-03-16 DIAGNOSIS — J45909 Unspecified asthma, uncomplicated: Secondary | ICD-10-CM

## 2014-03-16 DIAGNOSIS — N289 Disorder of kidney and ureter, unspecified: Secondary | ICD-10-CM

## 2014-03-16 DIAGNOSIS — E039 Hypothyroidism, unspecified: Secondary | ICD-10-CM

## 2014-03-16 DIAGNOSIS — E785 Hyperlipidemia, unspecified: Secondary | ICD-10-CM

## 2014-03-16 DIAGNOSIS — M1990 Unspecified osteoarthritis, unspecified site: Secondary | ICD-10-CM

## 2014-03-16 DIAGNOSIS — R5381 Other malaise: Secondary | ICD-10-CM

## 2014-03-16 DIAGNOSIS — R51 Headache: Secondary | ICD-10-CM

## 2014-03-16 LAB — CBC WITH DIFFERENTIAL/PLATELET
BASOS PCT: 1 % (ref 0–1)
Basophils Absolute: 0.1 10*3/uL (ref 0.0–0.1)
EOS ABS: 0.1 10*3/uL (ref 0.0–0.7)
EOS PCT: 2 % (ref 0–5)
HCT: 36 % (ref 36.0–46.0)
Hemoglobin: 11.9 g/dL — ABNORMAL LOW (ref 12.0–15.0)
LYMPHS ABS: 2 10*3/uL (ref 0.7–4.0)
Lymphocytes Relative: 32 % (ref 12–46)
MCH: 29.7 pg (ref 26.0–34.0)
MCHC: 33.1 g/dL (ref 30.0–36.0)
MCV: 89.8 fL (ref 78.0–100.0)
Monocytes Absolute: 0.6 10*3/uL (ref 0.1–1.0)
Monocytes Relative: 10 % (ref 3–12)
NEUTROS PCT: 55 % (ref 43–77)
Neutro Abs: 3.4 10*3/uL (ref 1.7–7.7)
PLATELETS: 202 10*3/uL (ref 150–400)
RBC: 4.01 MIL/uL (ref 3.87–5.11)
RDW: 14.7 % (ref 11.5–15.5)
WBC: 6.2 10*3/uL (ref 4.0–10.5)

## 2014-03-16 LAB — HEPATIC FUNCTION PANEL
ALK PHOS: 53 U/L (ref 39–117)
AST: 14 U/L (ref 0–37)
Albumin: 3.7 g/dL (ref 3.5–5.2)
BILIRUBIN DIRECT: 0.2 mg/dL (ref 0.0–0.3)
BILIRUBIN INDIRECT: 0.5 mg/dL (ref 0.2–1.2)
BILIRUBIN TOTAL: 0.7 mg/dL (ref 0.2–1.2)
Total Protein: 6.1 g/dL (ref 6.0–8.3)

## 2014-03-16 LAB — BASIC METABOLIC PANEL WITH GFR
BUN: 33 mg/dL — AB (ref 6–23)
CALCIUM: 9.5 mg/dL (ref 8.4–10.5)
CO2: 24 meq/L (ref 19–32)
CREATININE: 1.49 mg/dL — AB (ref 0.50–1.10)
Chloride: 104 mEq/L (ref 96–112)
GFR, EST AFRICAN AMERICAN: 34 mL/min — AB
GFR, Est Non African American: 30 mL/min — ABNORMAL LOW
GLUCOSE: 105 mg/dL — AB (ref 70–99)
Potassium: 4.6 mEq/L (ref 3.5–5.3)
SODIUM: 139 meq/L (ref 135–145)

## 2014-03-16 LAB — TSH: TSH: 5.307 u[IU]/mL — AB (ref 0.350–4.500)

## 2014-03-16 NOTE — Patient Instructions (Signed)
Tension Headache A tension headache is a feeling of pain, pressure, or aching often felt over the front and sides of the head. The pain can be dull or can feel tight (constricting). It is the most common type of headache. Tension headaches are not normally associated with nausea or vomiting and do not get worse with physical activity. Tension headaches can last 30 minutes to several days.  CAUSES  The exact cause is not known, but it may be caused by chemicals and hormones in the brain that lead to pain. Tension headaches often begin after stress, anxiety, or depression. Other triggers may include:  Alcohol.  Caffeine (too much or withdrawal).  Respiratory infections (colds, flu, sinus infections).  Dental problems or teeth clenching.  Fatigue.  Holding your head and neck in one position too long while using a computer. SYMPTOMS   Pressure around the head.   Dull, aching head pain.   Pain felt over the front and sides of the head.   Tenderness in the muscles of the head, neck, and shoulders. DIAGNOSIS  A tension headache is often diagnosed based on:   Symptoms.   Physical examination.   A CT scan or MRI of your head. These tests may be ordered if symptoms are severe or unusual. TREATMENT  Medicines may be given to help relieve symptoms.  HOME CARE INSTRUCTIONS   Only take over-the-counter or prescription medicines for pain or discomfort as directed by your caregiver.   Lie down in a dark, quiet room when you have a headache.   Keep a journal to find out what may be triggering your headaches. For example, write down:  What you eat and drink.  How much sleep you get.  Any change to your diet or medicines.  Try massage or other relaxation techniques.   Ice packs or heat applied to the head and neck can be used. Use these 3 to 4 times per day for 15 to 20 minutes each time, or as needed.   Limit stress.   Sit up straight, and do not tense your muscles.    Quit smoking if you smoke.  Limit alcohol use.  Decrease the amount of caffeine you drink, or stop drinking caffeine.  Eat and exercise regularly.  Get 7 to 9 hours of sleep, or as recommended by your caregiver.  Avoid excessive use of pain medicine as recurrent headaches can occur.  SEEK MEDICAL CARE IF:   You have problems with the medicines you were prescribed.  Your medicines do not work.  You have a change from the usual headache.  You have nausea or vomiting. SEEK IMMEDIATE MEDICAL CARE IF:   Your headache becomes severe.  You have a fever.  You have a stiff neck.  You have loss of vision.  You have muscular weakness or loss of muscle control.  You lose your balance or have trouble walking.  You feel faint or pass out.  You have severe symptoms that are different from your first symptoms. MAKE SURE YOU:   Understand these instructions.  Will watch your condition.  Will get help right away if you are not doing well or get worse. Document Released: 11/24/2005 Document Revised: 02/16/2012 Document Reviewed: 11/14/2011 [facility] Patient Information 2014 [place], [place]. Vertigo Vertigo means you feel like you are moving when you are not. Vertigo can make you feel like things around you are moving when they are not. This problem often goes away on its own.  HOME CARE   Follow  your doctor's instructions.  Avoid driving.  Avoid using heavy machinery.  Avoid doing any activity that could be dangerous if you have a vertigo attack.  Tell your doctor if a medicine seems to cause your vertigo. GET HELP RIGHT AWAY IF:   Your medicines do not help or make you feel worse.  You have trouble talking or walking.  You feel weak or have trouble using your arms, hands, or legs.  You have bad headaches.  You keep feeling sick to your stomach (nauseous) or throwing up (vomiting).  Your vision changes.  A family member notices changes in your  behavior.  Your problems get worse. MAKE SURE YOU:  Understand these instructions.  Will watch your condition.  Will get help right away if you are not doing well or get worse. Document Released: 09/02/2008 Document Revised: 02/16/2012 Document Reviewed: 06/12/2011 [facility] Patient Information 2014 [place].
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
## 2014-03-19 ENCOUNTER — Encounter: Payer: Self-pay | Admitting: Emergency Medicine

## 2014-03-19 DIAGNOSIS — N289 Disorder of kidney and ureter, unspecified: Secondary | ICD-10-CM | POA: Insufficient documentation

## 2014-03-19 DIAGNOSIS — E559 Vitamin D deficiency, unspecified: Secondary | ICD-10-CM | POA: Insufficient documentation

## 2014-03-19 DIAGNOSIS — E785 Hyperlipidemia, unspecified: Secondary | ICD-10-CM | POA: Insufficient documentation

## 2014-03-19 DIAGNOSIS — E039 Hypothyroidism, unspecified: Secondary | ICD-10-CM | POA: Insufficient documentation

## 2014-03-19 DIAGNOSIS — J45909 Unspecified asthma, uncomplicated: Secondary | ICD-10-CM | POA: Insufficient documentation

## 2014-03-19 DIAGNOSIS — K219 Gastro-esophageal reflux disease without esophagitis: Secondary | ICD-10-CM | POA: Insufficient documentation

## 2014-03-19 DIAGNOSIS — I10 Essential (primary) hypertension: Secondary | ICD-10-CM | POA: Insufficient documentation

## 2014-03-19 DIAGNOSIS — M1990 Unspecified osteoarthritis, unspecified site: Secondary | ICD-10-CM | POA: Insufficient documentation

## 2014-03-24 ENCOUNTER — Other Ambulatory Visit: Payer: Self-pay | Admitting: Emergency Medicine

## 2014-03-24 ENCOUNTER — Ambulatory Visit
Admission: RE | Admit: 2014-03-24 | Discharge: 2014-03-24 | Disposition: A | Discharge status: Home / Self Care | Payer: Medicare Other | Source: Ambulatory Visit | Attending: Emergency Medicine | Admitting: Emergency Medicine

## 2014-03-24 DIAGNOSIS — R2689 Other abnormalities of gait and mobility: Secondary | ICD-10-CM

## 2014-03-24 DIAGNOSIS — R519 Headache, unspecified: Secondary | ICD-10-CM

## 2014-03-24 DIAGNOSIS — R51 Headache: Secondary | ICD-10-CM

## 2014-04-12 ENCOUNTER — Encounter: Payer: Self-pay | Admitting: Internal Medicine

## 2014-04-18 ENCOUNTER — Other Ambulatory Visit: Payer: Self-pay | Admitting: Internal Medicine

## 2014-04-24 ENCOUNTER — Ambulatory Visit (INDEPENDENT_AMBULATORY_CARE_PROVIDER_SITE_OTHER): Payer: Medicare Other | Admitting: Physician Assistant

## 2014-04-24 ENCOUNTER — Encounter: Payer: Self-pay | Admitting: Physician Assistant

## 2014-04-24 VITALS — BP 138/68 | HR 64 | Temp 98.9°F | Resp 16 | Ht 65.5 in | Wt 135.0 lb

## 2014-04-24 DIAGNOSIS — E785 Hyperlipidemia, unspecified: Secondary | ICD-10-CM

## 2014-04-24 DIAGNOSIS — I10 Essential (primary) hypertension: Secondary | ICD-10-CM

## 2014-04-24 DIAGNOSIS — N3000 Acute cystitis without hematuria: Secondary | ICD-10-CM

## 2014-04-24 DIAGNOSIS — Z789 Other specified health status: Secondary | ICD-10-CM

## 2014-04-24 DIAGNOSIS — N289 Disorder of kidney and ureter, unspecified: Secondary | ICD-10-CM

## 2014-04-24 DIAGNOSIS — Z1331 Encounter for screening for depression: Secondary | ICD-10-CM

## 2014-04-24 DIAGNOSIS — E559 Vitamin D deficiency, unspecified: Secondary | ICD-10-CM

## 2014-04-24 DIAGNOSIS — Z79899 Other long term (current) drug therapy: Secondary | ICD-10-CM

## 2014-04-24 DIAGNOSIS — Z0000 Encounter for general adult medical examination without abnormal findings: Secondary | ICD-10-CM

## 2014-04-24 DIAGNOSIS — E538 Deficiency of other specified B group vitamins: Secondary | ICD-10-CM

## 2014-04-24 DIAGNOSIS — E039 Hypothyroidism, unspecified: Secondary | ICD-10-CM

## 2014-04-24 LAB — HEPATIC FUNCTION PANEL
ALBUMIN: 4.3 g/dL (ref 3.5–5.2)
ALT: 8 U/L (ref 0–35)
AST: 17 U/L (ref 0–37)
Alkaline Phosphatase: 54 U/L (ref 39–117)
Bilirubin, Direct: 0.1 mg/dL (ref 0.0–0.3)
Indirect Bilirubin: 0.5 mg/dL (ref 0.2–1.2)
TOTAL PROTEIN: 7 g/dL (ref 6.0–8.3)
Total Bilirubin: 0.6 mg/dL (ref 0.2–1.2)

## 2014-04-24 LAB — CBC WITH DIFFERENTIAL/PLATELET
BASOS ABS: 0.1 10*3/uL (ref 0.0–0.1)
BASOS PCT: 1 % (ref 0–1)
EOS ABS: 0.2 10*3/uL (ref 0.0–0.7)
Eosinophils Relative: 3 % (ref 0–5)
HEMATOCRIT: 37.4 % (ref 36.0–46.0)
HEMOGLOBIN: 12.4 g/dL (ref 12.0–15.0)
Lymphocytes Relative: 33 % (ref 12–46)
Lymphs Abs: 2 10*3/uL (ref 0.7–4.0)
MCH: 29.6 pg (ref 26.0–34.0)
MCHC: 33.2 g/dL (ref 30.0–36.0)
MCV: 89.3 fL (ref 78.0–100.0)
MONO ABS: 0.6 10*3/uL (ref 0.1–1.0)
MONOS PCT: 10 % (ref 3–12)
NEUTROS PCT: 53 % (ref 43–77)
Neutro Abs: 3.2 10*3/uL (ref 1.7–7.7)
Platelets: 243 10*3/uL (ref 150–400)
RBC: 4.19 MIL/uL (ref 3.87–5.11)
RDW: 14 % (ref 11.5–15.5)
WBC: 6.1 10*3/uL (ref 4.0–10.5)

## 2014-04-24 LAB — MAGNESIUM: Magnesium: 2.2 mg/dL (ref 1.5–2.5)

## 2014-04-24 LAB — BASIC METABOLIC PANEL WITH GFR
BUN: 24 mg/dL — AB (ref 6–23)
CALCIUM: 10 mg/dL (ref 8.4–10.5)
CO2: 29 mEq/L (ref 19–32)
Chloride: 106 mEq/L (ref 96–112)
Creat: 1.43 mg/dL — ABNORMAL HIGH (ref 0.50–1.10)
GFR, EST AFRICAN AMERICAN: 36 mL/min — AB
GFR, EST NON AFRICAN AMERICAN: 31 mL/min — AB
GLUCOSE: 91 mg/dL (ref 70–99)
Potassium: 5.3 mEq/L (ref 3.5–5.3)
Sodium: 143 mEq/L (ref 135–145)

## 2014-04-24 LAB — LIPID PANEL
CHOLESTEROL: 176 mg/dL (ref 0–200)
HDL: 57 mg/dL (ref >39)
LDL Cholesterol: 102 mg/dL — ABNORMAL HIGH (ref 0–99)
TRIGLYCERIDES: 85 mg/dL (ref <150)
Total CHOL/HDL Ratio: 3.1 Ratio
VLDL: 17 mg/dL (ref 0–40)

## 2014-04-24 LAB — VITAMIN B12: Vitamin B-12: >2000 pg/mL — ABNORMAL HIGH (ref 211–911)

## 2014-04-24 LAB — TSH: TSH: 3.115 u[IU]/mL (ref 0.350–4.500)

## 2014-04-24 MED ORDER — ALBUTEROL SULFATE HFA 108 (90 BASE) MCG/ACT IN AERS: 2.0000 | INHALATION_SPRAY | Freq: Four times a day (QID) | RESPIRATORY_TRACT | Status: DC | PRN

## 2014-04-24 NOTE — Patient Instructions (Signed)

## 2014-04-24 NOTE — Progress Notes (Signed)
Subjective:   Kelly Li is a 80 y.o. female who presents for Medicare Annual Wellness Visit and complete physical.    Date of last medicare wellness visit is unknown.  Her blood pressure has been controlled at home, today their BP is BP: 138/68 mmHg She does workout, she walks to and from her mailbox, walks in grocery store, walks with granddaugter and great grandson, active at home. She denies chest pain, shortness of breath, dizziness. Uses proventil occ but otherwise normal.  She is not on cholesterol medication and denies myalgias. Her cholesterol is at goal. The cholesterol last visit was:   Lab Results  Component Value Date   CHOL 144 10/14/2013   HDL 42 10/14/2013   LDLCALC 75 10/14/2013   TRIG 134 10/14/2013   CHOLHDL 3.4 10/14/2013   Last A1C in the office was:  Lab Results  Component Value Date   HGBA1C 5.2 10/14/2013   Patient is on Vitamin D supplement.   She has lower back pain and hip pain occ.  She has epigastric tenderness that occ goes to her back, and has some GERD symptoms She has urinary incontinence and RLQ pain, denies diarrhea, contipation, blood in stool, dark stool.    Names of Other Physician/Practitioners you currently use: 1. [facility]- here for primary care 2. Dr. Nelle Donhollander, eye doctor, last visit q 3 months bifocals.  3. Dr. Suzzanne CloudGeorge Hannah, dentist, last visit q 6 months Patient Care Team: Provider Not In System as PCP - General   Medication Review Current Outpatient Prescriptions on File Prior to Visit  Medication Sig Dispense Refill  . albuterol (PROVENTIL HFA;VENTOLIN HFA) 108 (90 BASE) MCG/ACT inhaler Inhale 2 puffs into the lungs every 6 (six) hours as needed. Shortness of breath  1 Inhaler  3  . aspirin EC 81 MG tablet Take 81 mg by mouth daily.      . bimatoprost (LUMIGAN) 0.01 % SOLN Place 1 drop into both eyes at bedtime.      . cholecalciferol (VITAMIN D) 1000 UNITS tablet Take 2,000 Units by mouth  daily.      . Cyanocobalamin (B-12) 3000 MCG SUBL Place 3,000 mcg under the tongue daily.      . fluticasone (FLONASE) 50 MCG/ACT nasal spray Place 2 sprays into both nostrils daily.      Marland Kitchen. levothyroxine (SYNTHROID, LEVOTHROID) 100 MCG tablet Take 100 mcg by mouth daily.       . Multiple Vitamins-Minerals (ICAPS) TABS Take 1 tablet by mouth daily.      . pantoprazole (PROTONIX) 40 MG tablet TAKE 1 TABLET EVERY DAY FOR ACID REFLUX  90 tablet  1   No current facility-administered medications on file prior to visit.    Current Problems (verified) Patient Active Problem List   Diagnosis Date Noted  . Asthma   . Unspecified hypothyroidism   . Hypertension   . GERD (gastroesophageal reflux disease)   . Vitamin D deficiency   . Hyperlipidemia   . DJD (degenerative joint disease)   . Renal insufficiency   . Abdominal wall pain in right lower quadrant 10/14/2013    Screening Tests Health Maintenance  Topic Date Due  . Colonoscopy  07/29/1969  . Zostavax  07/30/1979  . Influenza Vaccine  07/08/2014  . Tetanus/tdap  04/08/2023  . Pneumococcal Polysaccharide Vaccine Age 78 And Over  Completed    Immunization History  Administered Date(s) Administered  . Influenza-Unspecified 08/16/2013  . Pneumococcal Polysaccharide-23 07/08/2001  . Tdap 04/07/2013  Preventative care: Last colonoscopy: 2003 Last mammogram: declines Last pap smear/pelvic exam: declines  DEXA: 2009 declines  Prior vaccinations: TD or Tdap: 2014  Influenza: 2014 Pneumococcal: 2002 and per daughter she does not want Prevnar 13.  Shingles/Zostavax: declines  History reviewed: allergies, current medications, past family history, past medical history, past social history, past surgical history and problem list  Risk Factors: Osteoporosis: postmenopausal estrogen deficiency and dietary calcium and/or vitamin D deficiency History of fracture in the past year: No  Tobacco History  Substance Use Topics  .  Smoking status: Never Smoker   . Smokeless tobacco: Not on file  . Alcohol Use: No   She does not smoke.  Patient is a former smoker. Are there smokers in your home (other than you)?  No  Alcohol Current alcohol use: none  Caffeine Current caffeine use: denies use  Exercise Current exercise habits: The patient does not participate in regular exercise at present.  Current exercise: housecleaning  Nutrition/Diet Current diet: in general, a "healthy" diet    Cardiac risk factors: advanced age (older than 51 for men, 8 for women), dyslipidemia, hypertension and sedentary lifestyle.  Depression Screen (Note: if answer to either of the following is "Yes", a more complete depression screening is indicated)   Q1: Over the past two weeks, have you felt down, depressed or hopeless? No  Q2: Over the past two weeks, have you felt little interest or pleasure in doing things? No  Have you lost interest or pleasure in daily life? No  Do you often feel hopeless? No  Do you cry easily over simple problems? No  Activities of Daily Living In your present state of health, do you have any difficulty performing the following activities?:  Driving? Yes Managing money?  No Feeding yourself? No Getting from bed to chair? No Climbing a flight of stairs? Yes Preparing food and eating?: No Bathing or showering? No Getting dressed: No Getting to the toilet? No Using the toilet:No Moving around from place to place: No In the past year have you fallen or had a near fall?:No   Are you sexually active?  No  Do you have more than one partner?  No  Vision Difficulties: No  Hearing Difficulties: Yes Do you often ask people to speak up or repeat themselves? Yes Do you experience ringing or noises in your ears? No Do you have difficulty understanding soft or whispered voices? No  Cognition  Do you feel that you have a problem with memory?No  Do you often misplace items? No  Do you feel safe at  home?  Yes  Advanced directives Does patient have a Health Care Power of Attorney? Yes Does patient have a Living Will? Yes   Objective:     Blood pressure 138/68, pulse 64, temperature 98.9 F (37.2 C), resp. rate 16, height 5' 5.5" (1.664 m), weight 135 lb (61.236 kg). Body mass index is 22.12 kg/(m^2).  General appearance: alert, no distress, WD/WN,  female Cognitive Testing  Alert? Yes  Normal Appearance?Yes  Oriented to person? Yes  Place? Yes   Time? Yes  Recall of three objects?  Yes  Can perform simple calculations? Yes  Displays appropriate judgment?Yes  Can read the correct time from a watch face?Yes  HEENT: normocephalic, sclerae anicteric, TMs pearly, nares patent, no discharge or erythema, pharynx normal Oral cavity: MMM, no lesions Neck: supple, no lymphadenopathy, no thyromegaly, no masses Heart: RRR, normal S1, S2, no murmurs Lungs: CTA bilaterally, no wheezes, rhonchi, or  rales Abdomen: +bs, soft, epigastric and RLQ tendered without rebound, non distended, no masses, no hepatomegaly, no splenomegaly Musculoskeletal: nontender, no swelling, no obvious deformity Extremities: no edema, no cyanosis, no clubbing Pulses: 2+ symmetric, upper and lower extremities, normal cap refill Neurological: alert, oriented x 3, CN2-12 intact, strength normal upper extremities and lower extremities, sensation normal throughout, DTRs 2+ throughout, no cerebellar signs, gait normal Psychiatric: normal affect, behavior normal, pleasant  Breast: defer Gyn: defer   Rectal: defer  Assessment:   1. Hypertension - CBC with Differential - BASIC METABOLIC PANEL WITH GFR - Hepatic function panel - Microalbumin / creatinine urine ratio  2. Unspecified hypothyroidism - TSH  3. Renal insufficiency Check BMP  4. Vitamin D deficiency - Vit D  25 hydroxy (rtn osteoporosis monitoring)  5. Hyperlipidemia - Lipid panel  6. Acute cystitis - Urinalysis, Routine w reflex  microscopic - Urine culture  7. B12 deficiency - Vitamin B12  8. Encounter for long-term (current) use of other medications - Magnesium  9. Comfort measures At this time the patient and her daughter do not want any invasive test or test, they refuse EKG, etc and would only like to come back once a year. II agree with this. She is on minimal medications, has not had any falls and has a good support system. We will see her yearly and she will follow up as needed.    Plan:   During the course of the visit the patient was educated and counseled about appropriate screening and preventive services including:    Pneumococcal vaccine   Influenza vaccine  Td vaccine  Screening electrocardiogram  Screening mammography  Bone densitometry screening  Colorectal cancer screening  Diabetes screening  Glaucoma screening  Nutrition counseling   Screening recommendations, referrals:  Vaccinations: Tdap vaccine not indicated Influenza vaccine not indicated Pneumococcal vaccine not indicated Shingles vaccine declined Hep B vaccine not indicated  Nutrition assessed and recommended  Colonoscopy not indicated Mammogram declined Pap smear not indicated Pelvic exam not indicated Recommended yearly ophthalmology/optometry visit for glaucoma screening and checkup Recommended yearly dental visit for hygiene and checkup Advanced directives - requested  Conditions/risks identified: BMI: Discussed weight loss, diet, and increase physical activity.  Increase physical activity: AHA recommends 150 minutes of physical activity a week.  Medications reviewed DEXA- declined Diabetes at goal, ACE/ARB therapy No, Reason not on Ace Inhibitor/ARB therapy:  not needed Urinary Incontinence is an issue: discussed non pharmacology and pharmacology options.  Fall risk: moderate- discussed PT, home fall assessment, medications.   Medicare Attestation I have personally reviewed: The patient's  medical and social history Their use of alcohol, tobacco or illicit drugs Their current medications and supplements The patient's functional ability including ADLs,fall risks, home safety risks, cognitive, and hearing and visual impairment Diet and physical activities Evidence for depression or mood disorders  The patient's weight, height, BMI, and visual acuity have been recorded in the chart.  I have made referrals, counseling, and provided education to the patient based on review of the above and I have provided the patient with a written personalized care plan for preventive services.     Quentin Mullingmanda Jazel Nimmons, PA-C   04/24/2014

## 2014-04-25 LAB — URINALYSIS, ROUTINE W REFLEX MICROSCOPIC
Bilirubin Urine: NEGATIVE
GLUCOSE, UA: NEGATIVE mg/dL
Hgb urine dipstick: NEGATIVE
Ketones, ur: NEGATIVE mg/dL
LEUKOCYTES UA: NEGATIVE
Nitrite: NEGATIVE
PH: 6 (ref 5.0–8.0)
PROTEIN: NEGATIVE mg/dL
SPECIFIC GRAVITY, URINE: 1.014 (ref 1.005–1.030)
Urobilinogen, UA: 0.2 mg/dL (ref 0.0–1.0)

## 2014-04-25 LAB — URINE CULTURE

## 2014-04-25 LAB — MICROALBUMIN / CREATININE URINE RATIO
Creatinine, Urine: 66 mg/dL
Microalb Creat Ratio: 140.3 mg/g — ABNORMAL HIGH (ref 0.0–30.0)
Microalb, Ur: 9.26 mg/dL — ABNORMAL HIGH (ref 0.00–1.89)

## 2014-04-25 LAB — VITAMIN D 25 HYDROXY (VIT D DEFICIENCY, FRACTURES): Vit D, 25-Hydroxy: 74 ng/mL (ref 30–89)

## 2014-07-18 ENCOUNTER — Other Ambulatory Visit: Payer: Self-pay | Admitting: Internal Medicine

## 2014-08-01 ENCOUNTER — Telehealth: Payer: Self-pay | Admitting: Internal Medicine

## 2014-08-01 ENCOUNTER — Other Ambulatory Visit: Payer: Self-pay

## 2014-08-01 MED ORDER — TRAMADOL HCL 50 MG PO TABS: 50.0000 mg | ORAL_TABLET | Freq: Two times a day (BID) | ORAL | Status: DC | PRN

## 2014-08-01 NOTE — Telephone Encounter (Signed)
CALLED TO ASK THAT WE ONLY SPEAK TO SHE OR ROBERT, NOT [name].  ALSO, PT NOT GOING TO TAKE THE TRAMADOL, TRYING TYLENOL INSTEAD.  Thank you, Katrina Webb [place] [facility] [phone] Fax [phone]

## 2014-08-01 NOTE — Telephone Encounter (Signed)
Return call to [name] in regards to patient and pulled muscle causing neck/back  Pain, per Marchelle Folks called in Tramadol and advised patient would need to come in for evaluation if symptoms and pain persist.

## 2014-08-07 ENCOUNTER — Encounter (INDEPENDENT_AMBULATORY_CARE_PROVIDER_SITE_OTHER): Payer: Self-pay

## 2014-08-28 ENCOUNTER — Ambulatory Visit (INDEPENDENT_AMBULATORY_CARE_PROVIDER_SITE_OTHER): Payer: Medicare Other | Admitting: *Deleted

## 2014-08-28 DIAGNOSIS — Z23 Encounter for immunization: Secondary | ICD-10-CM

## 2014-09-18 ENCOUNTER — Ambulatory Visit (INDEPENDENT_AMBULATORY_CARE_PROVIDER_SITE_OTHER): Payer: Medicare Other | Admitting: *Deleted

## 2014-09-18 ENCOUNTER — Other Ambulatory Visit: Payer: Self-pay | Admitting: Internal Medicine

## 2014-09-18 DIAGNOSIS — R350 Frequency of micturition: Secondary | ICD-10-CM

## 2014-09-18 DIAGNOSIS — R300 Dysuria: Secondary | ICD-10-CM

## 2014-09-18 MED ORDER — CIPROFLOXACIN HCL 500 MG PO TABS: 500.0000 mg | ORAL_TABLET | Freq: Two times a day (BID) | ORAL | Status: AC

## 2014-09-18 NOTE — Progress Notes (Signed)
Patient ID: Kelly Li, female   DOB: 04/17/1917, 80 y.o.   MRN: [id number] Patient presents for UA, C&S with c/o dysuria, increased urine frequency, and decreased urine output.  Cipro rx sent into pharmacy for patient to start to help relieve symptoms and advised we may need to change abx when we receive UC results.

## 2014-09-19 LAB — URINALYSIS, MICROSCOPIC ONLY
Casts: NONE SEEN
Crystals: NONE SEEN

## 2014-09-19 LAB — URINALYSIS, ROUTINE W REFLEX MICROSCOPIC
BILIRUBIN URINE: NEGATIVE
GLUCOSE, UA: NEGATIVE mg/dL
KETONES UR: NEGATIVE mg/dL
Nitrite: NEGATIVE
Protein, ur: NEGATIVE mg/dL
SPECIFIC GRAVITY, URINE: 1.014 (ref 1.005–1.030)
UROBILINOGEN UA: 0.2 mg/dL (ref 0.0–1.0)
pH: 5 (ref 5.0–8.0)

## 2014-09-21 ENCOUNTER — Other Ambulatory Visit: Payer: Self-pay | Admitting: Internal Medicine

## 2014-09-21 LAB — URINE CULTURE

## 2014-10-25 ENCOUNTER — Ambulatory Visit: Payer: Medicare Other | Admitting: Internal Medicine

## 2014-10-25 NOTE — Progress Notes (Signed)
Patient ID: Kelly SimmondsViola Y Newburn, female   DOB: Jul 14, 1917, 80 y.o.   MRN: [id number]  Stephenie [facility]  O      S  H  O  W

## 2014-10-26 ENCOUNTER — Ambulatory Visit (INDEPENDENT_AMBULATORY_CARE_PROVIDER_SITE_OTHER): Payer: Medicare Other | Admitting: Internal Medicine

## 2014-10-26 ENCOUNTER — Encounter: Payer: Self-pay | Admitting: Internal Medicine

## 2014-10-26 VITALS — BP 110/68 | HR 60 | Temp 97.9°F | Resp 16 | Ht 65.5 in | Wt 126.0 lb

## 2014-10-26 DIAGNOSIS — E785 Hyperlipidemia, unspecified: Secondary | ICD-10-CM

## 2014-10-26 DIAGNOSIS — I10 Essential (primary) hypertension: Secondary | ICD-10-CM

## 2014-10-26 DIAGNOSIS — R7309 Other abnormal glucose: Secondary | ICD-10-CM | POA: Insufficient documentation

## 2014-10-26 DIAGNOSIS — E559 Vitamin D deficiency, unspecified: Secondary | ICD-10-CM

## 2014-10-26 DIAGNOSIS — Z79899 Other long term (current) drug therapy: Secondary | ICD-10-CM

## 2014-10-26 DIAGNOSIS — E039 Hypothyroidism, unspecified: Secondary | ICD-10-CM

## 2014-10-26 LAB — CBC WITH DIFFERENTIAL/PLATELET
BASOS ABS: 0.1 10*3/uL (ref 0.0–0.1)
Basophils Relative: 1 % (ref 0–1)
EOS ABS: 0.2 10*3/uL (ref 0.0–0.7)
Eosinophils Relative: 4 % (ref 0–5)
HCT: 34.7 % — ABNORMAL LOW (ref 36.0–46.0)
HEMOGLOBIN: 11.4 g/dL — AB (ref 12.0–15.0)
LYMPHS ABS: 1.7 10*3/uL (ref 0.7–4.0)
Lymphocytes Relative: 33 % (ref 12–46)
MCH: 29.1 pg (ref 26.0–34.0)
MCHC: 32.9 g/dL (ref 30.0–36.0)
MCV: 88.5 fL (ref 78.0–100.0)
MPV: 9.6 fL (ref 9.4–12.4)
Monocytes Absolute: 0.5 10*3/uL (ref 0.1–1.0)
Monocytes Relative: 10 % (ref 3–12)
NEUTROS PCT: 52 % (ref 43–77)
Neutro Abs: 2.7 10*3/uL (ref 1.7–7.7)
PLATELETS: 223 10*3/uL (ref 150–400)
RBC: 3.92 MIL/uL (ref 3.87–5.11)
RDW: 15.1 % (ref 11.5–15.5)
WBC: 5.1 10*3/uL (ref 4.0–10.5)

## 2014-10-26 LAB — HEMOGLOBIN A1C
Hgb A1c MFr Bld: 5.3 % (ref <5.7)
Mean Plasma Glucose: 105 mg/dL (ref <117)

## 2014-10-26 NOTE — Progress Notes (Signed)
Patient ID: Kelly Li, female   DOB: [birth year], 80 y.o.   MRN: [id number]   This very nice 80 y.o.WWF presents for 3 month follow up with Hypertension, Hyperlipidemia, Hypothyroidism, Pre-Diabetes and Vitamin D Deficiency.    Patient is treated for HTN & BP has been controlled at home. Today's BP: 110/68 mmHg. Patient has had no complaints of any cardiac type chest pain, palpitations, dyspnea/orthopnea/PND, dizziness, claudication, or dependent edema.   Hyperlipidemia is controlled with diet. Patient denies myalgias or other med SE's. Last Lipids were at goal - Total Chol 176; HDL  57; LDL 102; Triglycerides 85 on  04/24/2014.   Also, the patient is screened for PreDiabetes and has had no symptoms of reactive hypoglycemia, diabetic polys, paresthesias or visual blurring.  Last A1c was 5.3% in May 2014.    Further, the patient also has history of Vitamin D Deficiency (28 in 2008) and supplements vitamin D without any suspected side-effects. Last vitamin D was  74 on  04/24/2014.   Medication List   aspirin EC 81 MG tablet  Take 81 mg by mouth daily.     B-12 3000 MCG Subl  Place 3,000 mcg under the tongue daily.     bimatoprost 0.01 % Soln  Commonly known as:  LUMIGAN  Place 1 drop into both eyes at bedtime.     cholecalciferol 1000 UNITS tablet  Commonly known as:  VITAMIN D  Take 2,000 Units by mouth daily.     fluticasone 50 MCG/ACT nasal spray  Commonly known as:  FLONASE  Place 2 sprays into both nostrils daily.     ICAPS Tabs  Take 1 tablet by mouth daily.     levothyroxine 100 MCG tablet  Commonly known as:  SYNTHROID, LEVOTHROID  TAKE 1 TABLET BY MOUTH EVERY DAY     pantoprazole 40 MG tablet  Commonly known as:  PROTONIX  TAKE 1 TABLET EVERY DAY FOR ACID REFLUX     traMADol 50 MG tablet  Commonly known as:  ULTRAM  Take 1 tablet (50 mg total) by mouth 2 (two) times daily as needed.     VESICARE 5 MG tablet  Generic drug:  solifenacin  TAKE 1 TABLET BY MOUTH  EVERY DAY FOR OVER ACTIVE BLADDER     Allergies  Allergen Reactions  . Codeine Anaphylaxis    unknown  . Morphine And Related Nausea And Vomiting  . Nsaids    PMHx:   Past Medical History  Diagnosis Date  . Asthma   . Unspecified hypothyroidism   . Hypertension   . GERD (gastroesophageal reflux disease)   . Vitamin D deficiency   . Hyperlipidemia   . Renal insufficiency   . DJD (degenerative joint disease)    Immunization History  Administered Date(s) Administered  . Influenza, High Dose Seasonal PF 08/28/2014  . Influenza-Unspecified 08/16/2013  . Pneumococcal Conjugate-13 08/28/2014  . Pneumococcal Polysaccharide-23 07/08/2001  . Tdap 04/07/2013   Past Surgical History  Procedure Laterality Date  . Hip surgery    . Thyroid surgery    . Esophagogastroduodenoscopy  2009  . Nephrectomy  2003  . Colonoscopy  2003  . Breast biopsy Right 1997   FHx:    Reviewed / unchanged  SHx:    Reviewed / unchanged  Systems Review:  Constitutional: Denies fever, chills, wt changes, headaches, insomnia, fatigue, night sweats, change in appetite. Eyes: Denies redness, blurred vision, diplopia, discharge, itchy, watery eyes.  ENT: Denies discharge, congestion, post nasal drip, epistaxis, sore  throat, earache, hearing loss, dental pain, tinnitus, vertigo, sinus pain, snoring.  CV: Denies chest pain, palpitations, irregular heartbeat, syncope, dyspnea, diaphoresis, orthopnea, PND, claudication or edema. Respiratory: denies cough, dyspnea, DOE, pleurisy, hoarseness, laryngitis, wheezing.  Gastrointestinal: Denies dysphagia, odynophagia, heartburn, reflux, water brash, abdominal pain or cramps, nausea, vomiting, bloating, diarrhea, constipation, hematemesis, melena, hematochezia  or hemorrhoids. Genitourinary: Denies dysuria, frequency, urgency, nocturia, hesitancy, discharge, hematuria or flank pain. Musculoskeletal: Denies arthralgias, myalgias, stiffness, jt. swelling, pain, limping or  strain/sprain.  Skin: Denies pruritus, rash, hives, warts, acne, eczema or change in skin lesion(s). Neuro: No weakness, tremor, incoordination, spasms, paresthesia or pain. Psychiatric: Denies confusion, memory loss or sensory loss. Endo: Denies change in weight, skin or hair change.  Heme/Lymph: No excessive bleeding, bruising or enlarged lymph nodes.  Exam:  BP 110/68 mmHg  Pulse 60  Temp(Src) 97.9 F (36.6 C)  Resp 16  Ht 5' 5.5" (1.664 m)  Wt 126 lb (57.153 kg)  BMI 20.64 kg/m2  Appears well nourished and in no distress. Eyes: PERRLA, EOMs, conjunctiva no swelling or erythema. Sinuses: No frontal/maxillary tenderness ENT/Mouth: EAC's clear, TM's nl w/o erythema, bulging. Nares clear w/o erythema, swelling, exudates. Oropharynx clear without erythema or exudates. Oral hygiene is good. Tongue normal, non obstructing. Hearing intact.  Neck: Supple. Thyroid nl. Car 2+/2+ without bruits, nodes or JVD. Chest: Respirations nl with BS clear & equal w/o rales, rhonchi, wheezing or stridor.  Cor: Heart sounds normal w/ regular rate and rhythm without sig. murmurs, gallops, clicks, or rubs. Peripheral pulses normal and equal  without edema.  Abdomen: Soft & bowel sounds normal. Non-tender w/o guarding, rebound, hernias, masses, or organomegaly.  Lymphatics: Unremarkable.  Musculoskeletal: Full ROM all peripheral extremities, joint stability, 5/5 strength, and normal gait.  Skin: Warm, dry without exposed rashes, lesions or ecchymosis apparent.  Neuro: Cranial nerves intact, reflexes equal bilaterally. Sensory-motor testing grossly intact. Tendon reflexes grossly intact.  Pysch: Alert & oriented x 3.  Insight and judgement nl & appropriate. No ideations.  Assessment and Plan:  1. Hypertension - Continue monitor blood pressure at home. Continue diet/meds same.  2. Hyperlipidemia - Continue diet/meds, exercise,& lifestyle modifications. Continue monitor periodic cholesterol/liver & renal  functions   3. T2_NIDDM Pre-Diabetes - Continue diet, exercise, lifestyle modifications. Monitor appropriate labs.  4. Vitamin D Deficiency - Continue supplementation.   Recommended regular exercise, BP monitoring, weight control, and discussed med and SE's. Recommended labs to assess and monitor clinical status. Further disposition pending results of labs.

## 2014-10-27 LAB — BASIC METABOLIC PANEL WITH GFR
BUN: 23 mg/dL (ref 6–23)
CALCIUM: 9.1 mg/dL (ref 8.4–10.5)
CO2: 26 mEq/L (ref 19–32)
CREATININE: 1.33 mg/dL — AB (ref 0.50–1.10)
Chloride: 108 mEq/L (ref 96–112)
GFR, EST AFRICAN AMERICAN: 39 mL/min — AB
GFR, EST NON AFRICAN AMERICAN: 34 mL/min — AB
Glucose, Bld: 91 mg/dL (ref 70–99)
Potassium: 4.5 mEq/L (ref 3.5–5.3)
SODIUM: 141 meq/L (ref 135–145)

## 2014-10-27 LAB — HEPATIC FUNCTION PANEL
ALBUMIN: 3.6 g/dL (ref 3.5–5.2)
ALT: <8 U/L (ref 0–35)
AST: 10 U/L (ref 0–37)
Alkaline Phosphatase: 53 U/L (ref 39–117)
BILIRUBIN DIRECT: 0.1 mg/dL (ref 0.0–0.3)
Indirect Bilirubin: 0.5 mg/dL (ref 0.2–1.2)
Total Bilirubin: 0.6 mg/dL (ref 0.2–1.2)
Total Protein: 6.2 g/dL (ref 6.0–8.3)

## 2014-10-27 LAB — VITAMIN D 25 HYDROXY (VIT D DEFICIENCY, FRACTURES): Vit D, 25-Hydroxy: 65 ng/mL (ref 30–100)

## 2014-10-27 LAB — MAGNESIUM: MAGNESIUM: 2 mg/dL (ref 1.5–2.5)

## 2014-10-27 LAB — INSULIN, FASTING: Insulin fasting, serum: 4.5 u[IU]/mL (ref 2.0–19.6)

## 2014-10-27 LAB — TSH: TSH: 1.721 u[IU]/mL (ref 0.350–4.500)

## 2014-11-09 ENCOUNTER — Other Ambulatory Visit: Payer: Self-pay | Admitting: Physician Assistant

## 2014-11-09 MED ORDER — PREDNISONE 20 MG PO TABS: ORAL_TABLET | ORAL | Status: DC

## 2014-12-11 ENCOUNTER — Other Ambulatory Visit (HOSPITAL_COMMUNITY): Payer: Self-pay | Admitting: Orthopedic Surgery

## 2014-12-11 DIAGNOSIS — Z96649 Presence of unspecified artificial hip joint: Principal | ICD-10-CM

## 2014-12-11 DIAGNOSIS — T84038A Mechanical loosening of other internal prosthetic joint, initial encounter: Secondary | ICD-10-CM

## 2014-12-15 ENCOUNTER — Other Ambulatory Visit: Payer: Self-pay | Admitting: Physician Assistant

## 2014-12-22 ENCOUNTER — Encounter (HOSPITAL_COMMUNITY)
Admission: RE | Admit: 2014-12-22 | Discharge: 2014-12-22 | Disposition: A | Discharge status: Home / Self Care | Payer: Medicare Other | Source: Ambulatory Visit | Attending: Orthopedic Surgery | Admitting: Orthopedic Surgery

## 2014-12-22 DIAGNOSIS — Z96649 Presence of unspecified artificial hip joint: Secondary | ICD-10-CM

## 2014-12-22 DIAGNOSIS — Z96641 Presence of right artificial hip joint: Secondary | ICD-10-CM | POA: Insufficient documentation

## 2014-12-22 DIAGNOSIS — T84038A Mechanical loosening of other internal prosthetic joint, initial encounter: Secondary | ICD-10-CM

## 2014-12-22 DIAGNOSIS — M25551 Pain in right hip: Secondary | ICD-10-CM | POA: Insufficient documentation

## 2014-12-22 MED ORDER — TECHNETIUM TC 99M MEDRONATE IV KIT
26.3000 | PACK | Freq: Once | INTRAVENOUS | Status: AC | PRN
  Administered 2014-12-22: 26.3 via INTRAVENOUS

## 2015-02-01 ENCOUNTER — Encounter: Payer: Self-pay | Admitting: Physician Assistant

## 2015-02-01 ENCOUNTER — Ambulatory Visit (INDEPENDENT_AMBULATORY_CARE_PROVIDER_SITE_OTHER): Payer: Medicare Other | Admitting: Physician Assistant

## 2015-02-01 ENCOUNTER — Ambulatory Visit: Payer: Self-pay | Admitting: Physician Assistant

## 2015-02-01 VITALS — BP 138/70 | HR 64 | Temp 98.6°F | Resp 16 | Ht 65.5 in | Wt 125.0 lb

## 2015-02-01 DIAGNOSIS — R7309 Other abnormal glucose: Secondary | ICD-10-CM

## 2015-02-01 DIAGNOSIS — E039 Hypothyroidism, unspecified: Secondary | ICD-10-CM

## 2015-02-01 DIAGNOSIS — E785 Hyperlipidemia, unspecified: Secondary | ICD-10-CM

## 2015-02-01 DIAGNOSIS — Z79899 Other long term (current) drug therapy: Secondary | ICD-10-CM

## 2015-02-01 DIAGNOSIS — E559 Vitamin D deficiency, unspecified: Secondary | ICD-10-CM

## 2015-02-01 DIAGNOSIS — I10 Essential (primary) hypertension: Secondary | ICD-10-CM

## 2015-02-01 LAB — BASIC METABOLIC PANEL WITH GFR
BUN: 36 mg/dL — ABNORMAL HIGH (ref 6–23)
CHLORIDE: 107 meq/L (ref 96–112)
CO2: 25 meq/L (ref 19–32)
CREATININE: 1.51 mg/dL — AB (ref 0.50–1.10)
Calcium: 9.3 mg/dL (ref 8.4–10.5)
GFR, Est African American: 34 mL/min — ABNORMAL LOW
GFR, Est Non African American: 29 mL/min — ABNORMAL LOW
Glucose, Bld: 95 mg/dL (ref 70–99)
POTASSIUM: 5 meq/L (ref 3.5–5.3)
SODIUM: 140 meq/L (ref 135–145)

## 2015-02-01 LAB — CBC WITH DIFFERENTIAL/PLATELET
BASOS ABS: 0 10*3/uL (ref 0.0–0.1)
Basophils Relative: 0 % (ref 0–1)
Eosinophils Absolute: 0.2 10*3/uL (ref 0.0–0.7)
Eosinophils Relative: 3 % (ref 0–5)
HEMATOCRIT: 38.1 % (ref 36.0–46.0)
Hemoglobin: 12.4 g/dL (ref 12.0–15.0)
LYMPHS ABS: 1.7 10*3/uL (ref 0.7–4.0)
LYMPHS PCT: 30 % (ref 12–46)
MCH: 29.8 pg (ref 26.0–34.0)
MCHC: 32.5 g/dL (ref 30.0–36.0)
MCV: 91.6 fL (ref 78.0–100.0)
MONO ABS: 0.6 10*3/uL (ref 0.1–1.0)
MONOS PCT: 10 % (ref 3–12)
MPV: 10.2 fL (ref 8.6–12.4)
NEUTROS ABS: 3.2 10*3/uL (ref 1.7–7.7)
Neutrophils Relative %: 57 % (ref 43–77)
Platelets: 195 10*3/uL (ref 150–400)
RBC: 4.16 MIL/uL (ref 3.87–5.11)
RDW: 15.2 % (ref 11.5–15.5)
WBC: 5.7 10*3/uL (ref 4.0–10.5)

## 2015-02-01 LAB — LIPID PANEL
CHOLESTEROL: 149 mg/dL (ref 0–200)
HDL: 53 mg/dL (ref >=46)
LDL Cholesterol: 82 mg/dL (ref 0–99)
TRIGLYCERIDES: 69 mg/dL (ref <150)
Total CHOL/HDL Ratio: 2.8 Ratio
VLDL: 14 mg/dL (ref 0–40)

## 2015-02-01 LAB — HEPATIC FUNCTION PANEL
ALK PHOS: 47 U/L (ref 39–117)
ALT: <8 U/L (ref 0–35)
AST: 13 U/L (ref 0–37)
Albumin: 3.9 g/dL (ref 3.5–5.2)
BILIRUBIN DIRECT: 0.1 mg/dL (ref 0.0–0.3)
BILIRUBIN TOTAL: 0.7 mg/dL (ref 0.2–1.2)
Indirect Bilirubin: 0.6 mg/dL (ref 0.2–1.2)
Total Protein: 6.3 g/dL (ref 6.0–8.3)

## 2015-02-01 LAB — TSH: TSH: 1.35 u[IU]/mL (ref 0.350–4.500)

## 2015-02-01 LAB — MAGNESIUM: Magnesium: 2 mg/dL (ref 1.5–2.5)

## 2015-02-01 MED ORDER — RANITIDINE HCL 300 MG PO TABS: ORAL_TABLET | ORAL | Status: AC

## 2015-02-01 NOTE — Patient Instructions (Signed)
Stop vesicare and try myrebtriq samples 25mg  or 50mg  to decrease over active bladder.   Nexium/protonix/prilosec are called PPI's, they are great at healing your stomach but should only be taken for a short period of time.   Studies are showing that taken for a long time it can increase the risk of osteoporosis (weakening of your bones), pneumonia, low magnesium, restless legs, Cdiff (infection that causes diarrhea), and most recently kidney disease/insufficiency.  Due to this information we want to try to stop the PPI but if you try to stop it abruptly this can cause rebound acid and worsening symptoms.   So this is how we want you to get off the PPI: - Start taking the nexium/protonix/prilosec or which every PPI you are on every other day for 2 week while starting to take pepcid or zantac (generic is fine) 2 x a day - then decrease the PPI to every 3 days for 2 weeks and then stop while continuing on the zantac or pepcid twice daily. - then you can try once at night for 2 weeks - you can continue on this once at night or stop all together - Avoid alcohol, spicy foods, NSAIDS (aleve, ibuprofen) at this time. See foods below.   Food Choices for Gastroesophageal Reflux Disease When you have gastroesophageal reflux disease (GERD), the foods you eat and your eating habits are very important. Choosing the right foods can help ease the discomfort of GERD. WHAT GENERAL GUIDELINES DO I NEED TO FOLLOW?  Choose fruits, vegetables, whole grains, low-fat dairy products, and low-fat meat, fish, and poultry.  Limit fats such as oils, salad dressings, butter, nuts, and avocado.  Keep a food diary to identify foods that cause symptoms.  Avoid foods that cause reflux. These may be different for different people.  Eat frequent small meals instead of three large meals each day.  Eat your meals slowly, in a relaxed setting.  Limit fried foods.  Cook foods using methods other than frying.  Avoid  drinking alcohol.  Avoid drinking large amounts of liquids with your meals.  Avoid bending over or lying down until 2-3 hours after eating. WHAT FOODS ARE NOT RECOMMENDED? The following are some foods and drinks that may worsen your symptoms: Vegetables Tomatoes. Tomato juice. Tomato and spaghetti sauce. Chili peppers. Onion and garlic. Horseradish. Fruits Oranges, grapefruit, and lemon (fruit and juice). Meats High-fat meats, fish, and poultry. This includes hot dogs, ribs, ham, sausage, salami, and bacon. Dairy Whole milk and chocolate milk. Sour cream. Cream. Butter. Ice cream. Cream cheese.  Beverages Coffee and tea, with or without caffeine. Carbonated beverages or energy drinks. Condiments Hot sauce. Barbecue sauce.  Sweets/Desserts Chocolate and cocoa. Donuts. Peppermint and spearmint. Fats and Oils High-fat foods, including Jamaica[place] fries and potato chips. Other Vinegar. Strong spices, such as black pepper, white pepper, red pepper, cayenne, curry powder, cloves, ginger, and chili powder.

## 2015-02-01 NOTE — Progress Notes (Signed)
Assessment and Plan:  Hypertension: Continue medication, monitor blood pressure at home. Continue DASH diet.  Reminder to go to the ER if any CP, SOB, nausea, dizziness, severe HA, changes vision/speech, left arm numbness and tingling, and jaw pain. Cholesterol: Continue diet and exercise. Check cholesterol.  Hypothyroidism-check TSH level, continue medications the same, reminded to take on an empty stomach 30-1860mins before food.  Vitamin D Def- check level and continue medications.  Right hip pain- follow up Dr. Berton Li [facility]- continue vesicare GERD- will try to get off protonix, information given.   Continue diet and meds as discussed. Further disposition pending results of labs. After CPE will start every 6 months Future Appointments Date Time Provider Department Center  05/03/2015 9:00 AM Kelly MullingAmanda Collier, PA-C [facility] None    HPI 81 y.o. female  presents for 3 month follow up with hypertension, hyperlipidemia, prediabetes and vitamin D.  Her blood pressure has been controlled at home, today their BP is BP: 138/70 mmHg  She does not workout due to right hip pain. She denies chest pain, shortness of breath, dizziness.  She is not on cholesterol medication and denies myalgias. Her cholesterol is at goal. The cholesterol last visit was:   Lab Results  Component Value Date   CHOL 176 04/24/2014   HDL 57 04/24/2014   LDLCALC 102* 04/24/2014   TRIG 85 04/24/2014   CHOLHDL 3.1 04/24/2014  Last A1C in the office was:  Lab Results  Component Value Date   HGBA1C 5.3 10/26/2014  Patient is on Vitamin D supplement.   Lab Results  Component Value Date   VD25OH 5665 10/26/2014  She has seen Dr. Berton Li for her right hip pain,  she had a fracture and had surgery  In 2012. She now has "extra" bone that is causing bursitis and pain. Daughter is interested in surgery for her to help with pain, she has failed ESI.   She has GERD and is on protonix.  She is on thyroid medication. Her medication was  not changed last visit.   Lab Results  Component Value Date   TSH 1.721 10/26/2014  .   Current Medications:  Current Outpatient Prescriptions on File Prior to Visit  Medication Sig Dispense Refill  . albuterol (PROVENTIL HFA;VENTOLIN HFA) 108 (90 BASE) MCG/ACT inhaler Inhale 2 puffs into the lungs every 6 (six) hours as needed. Shortness of breath 1 Inhaler 3  . aspirin EC 81 MG tablet Take 81 mg by mouth daily.    . bimatoprost (LUMIGAN) 0.01 % SOLN Place 1 drop into both eyes at bedtime.    . cholecalciferol (VITAMIN D) 1000 UNITS tablet Take 2,000 Units by mouth daily.    . Cyanocobalamin (B-12) 3000 MCG SUBL Place 3,000 mcg under the tongue daily.    . fluticasone (FLONASE) 50 MCG/ACT nasal spray Place 2 sprays into both nostrils daily.    Marland Kitchen. levothyroxine (SYNTHROID, LEVOTHROID) 100 MCG tablet TAKE 1 TABLET BY MOUTH EVERY DAY 90 tablet 3  . Multiple Vitamins-Minerals (ICAPS) TABS Take 1 tablet by mouth daily.    . pantoprazole (PROTONIX) 40 MG tablet TAKE 1 TABLET EVERY DAY FOR ACID REFLUX 90 tablet 1  . VESICARE 5 MG tablet TAKE 1 TABLET BY MOUTH EVERY DAY FOR OVER ACTIVE BLADDER 30 tablet 3   No current facility-administered medications on file prior to visit.   Medical History:  Past Medical History  Diagnosis Date  . Asthma   . Unspecified hypothyroidism   . Hypertension   . GERD (  gastroesophageal reflux disease)   . Vitamin D deficiency   . Hyperlipidemia   . Renal insufficiency   . DJD (degenerative joint disease)    Allergies:  Allergies  Allergen Reactions  . Codeine Anaphylaxis    unknown  . Morphine And Related Nausea And Vomiting  . Nsaids      Review of Systems:  Review of Systems  Constitutional: Negative.   HENT: Positive for congestion. Negative for ear discharge, ear pain, hearing loss, nosebleeds, sore throat and tinnitus.   Respiratory: Negative.  Negative for stridor.   Cardiovascular: Negative.   Genitourinary: Positive for urgency and  frequency. Negative for dysuria, hematuria and flank pain.  Musculoskeletal: Positive for myalgias and joint pain (right hip). Negative for back pain, falls and neck pain.  Skin: Negative.   Neurological: Negative.  Negative for headaches.  Psychiatric/Behavioral: Negative.     Family history- Review and unchanged Social history- Review and unchanged Physical Exam: BP 138/70 mmHg  Pulse 64  Temp(Src) 98.6 F (37 C)  Resp 16  Ht 5' 5.5" (1.664 m)  Wt 125 lb (56.7 kg)  BMI 20.48 kg/m2 Wt Readings from Last 3 Encounters:  02/01/15 125 lb (56.7 kg)  10/26/14 126 lb (57.153 kg)  04/24/14 135 lb (61.236 kg)    HEENT: normocephalic, sclerae anicteric, TMs pearly, nares patent, no discharge or erythema, pharynx normal Oral cavity: MMM, no lesions Neck: supple, no lymphadenopathy, no thyromegaly, no masses Heart: RRR, normal S1, S2, no murmurs Lungs: CTA bilaterally, no wheezes, rhonchi, or rales Abdomen: +bs, soft, nontender without rebound, non distended, no masses, no hepatomegaly, no splenomegaly Musculoskeletal: nontender, no swelling, no obvious deformity Extremities: no edema, no cyanosis, no clubbing Pulses: 2+ symmetric, upper and lower extremities, normal cap refill Neurological: alert, oriented x 3, CN2-12 intact, strength normal upper extremities and lower extremities, sensation normal throughout, DTRs 2+ throughout, no cerebellar signs, gait normal. Psychiatric: normal affect, behavior normal, pleasant    Kelly Mulling, PA-C 9:38 AM [facility]

## 2015-02-02 LAB — VITAMIN D 25 HYDROXY (VIT D DEFICIENCY, FRACTURES): Vit D, 25-Hydroxy: 57 ng/mL (ref 30–100)

## 2015-04-03 ENCOUNTER — Ambulatory Visit: Payer: Self-pay | Admitting: Orthopedic Surgery

## 2015-04-03 NOTE — Progress Notes (Signed)
Preoperative surgical orders have been place into the Epic hospital system for Kelly Li on 04/03/2015, 9:56 AM  by [name], [name] for surgery on 04-23-2015.  Preop Hip orders including Experel Injecion, IV Tylenol, and IV Decadron as long as there are no contraindications to the above medications. [name], PA-C

## 2015-04-17 NOTE — Patient Instructions (Addendum)
20 Kelly SimmondsViola Y Li  04/17/2015   Your procedure is scheduled on:   -04-23-2015 Monday  [facility] and [facility]. Arrive at    4:00    PM.  Call this number if you have problems the morning of surgery: [phone]  Or Presurgical Testing [phone].   For Living Will and/or Health Care Power Attorney Forms: please provide copy for your medical record,may bring AM of surgery(Forms should be already notarized -we do not provide this service).(04-18-15 Yes/ information is scanned into Epic Chart 1'16).     Do not eat food/ or drink: After Midnight.  Exception: may have clear liquids:up to 6 Hours before arrival. Nothing after:1200 noon 04-23-15 Monday, then nothing.  Clear liquids include soda, tea, black coffee, apple or grape juice, broth,jello, iced popsicles.  Take these medicines the morning of surgery with A SIP OF WATER: Levothyroxine.  Ranitidine.  Use/ bring Inhalers, eye drops, nasal spray.    Do not wear jewelry, make-up or nail polish.  Do not wear deodorant, lotions, powders, or perfumes.   Do not shave legs and under arms- 48 hours(2 days) prior to first CHG shower.(Shaving face and neck okay.)  Do not bring valuables to the hospital.(Hospital is not responsible for lost valuables).  Contacts, dentures or removable bridgework, body piercing, hair pins may not be worn into surgery.  Leave suitcase in the car. After surgery it may be brought to your room.  For patients admitted to the hospital, checkout time is 11:00 AM the day of discharge.(Restricted visitors-Any Persons displaying flu-like symptoms or illness).    Patients discharged the day of surgery will not be allowed to drive home. Must have responsible person with you x 24 hours once discharged.  Name and phone number of your driver: Johnston EbbsSandra Wilson, daughter [phone] cell     Please read over the following fact sheets that you were given:  CHG(Chlorhexidine  Gluconate 4% Surgical Soap) use. Incentive Spirometry instructions.          [facility] - Preparing for Surgery Before surgery, you can play an important role.  Because skin is not sterile, your skin needs to be as free of germs as possible.  You can reduce the number of germs on your skin by washing with CHG (chlorahexidine gluconate) soap before surgery.  CHG is an antiseptic cleaner which kills germs and bonds with the skin to continue killing germs even after washing. Please DO NOT use if you have an allergy to CHG or antibacterial soaps.  If your skin becomes reddened/irritated stop using the CHG and inform your nurse when you arrive at Short Stay. Do not shave (including legs and underarms) for at least 48 hours prior to the first CHG shower.  You may shave your face/neck. Please follow these instructions carefully:  1.  Shower with CHG Soap the night before surgery and the  morning of Surgery.  2.  If you choose to wash your hair, wash your hair first as usual with your  normal  shampoo.  3.  After you shampoo, rinse your hair and body thoroughly to remove the  shampoo.                           4.  Use CHG as you would any other liquid soap.  You can apply chg directly  to the skin and wash  Gently with a scrungie or clean washcloth.  5.  Apply the CHG Soap to your body ONLY FROM THE NECK DOWN.   Do not use on face/ open                           Wound or open sores. Avoid contact with eyes, ears mouth and genitals (private parts).                       Wash face,  Genitals (private parts) with your normal soap.             6.  Wash thoroughly, paying special attention to the area where your surgery  will be performed.  7.  Thoroughly rinse your body with warm water from the neck down.  8.  DO NOT shower/wash with your normal soap after using and rinsing off  the CHG Soap.                9.  Pat yourself dry with a clean towel.            10.  Wear clean pajamas.             11.  Place clean sheets on your bed the night of your first shower and do not  sleep with pets. Day of Surgery : Do not apply any lotions/deodorants the morning of surgery.  Please wear clean clothes to the hospital/surgery center.  FAILURE TO FOLLOW THESE INSTRUCTIONS MAY RESULT IN THE CANCELLATION OF YOUR SURGERY PATIENT SIGNATURE_________________________________  NURSE SIGNATURE__________________________________  ________________________________________________________________________   Rogelia MireIncentive Spirometer  An incentive spirometer is a tool that can help keep your lungs clear and active. This tool measures how well you are filling your lungs with each breath. Taking long deep breaths may help reverse or decrease the chance of developing breathing (pulmonary) problems (especially infection) following:  A long period of time when you are unable to move or be active. BEFORE THE PROCEDURE   If the spirometer includes an indicator to show your best effort, your nurse or respiratory therapist will set it to a desired goal.  If possible, sit up straight or lean slightly forward. Try not to slouch.  Hold the incentive spirometer in an upright position. INSTRUCTIONS FOR USE  1. Sit on the edge of your bed if possible, or sit up as far as you can in bed or on a chair. 2. Hold the incentive spirometer in an upright position. 3. Breathe out normally. 4. Place the mouthpiece in your mouth and seal your lips tightly around it. 5. Breathe in slowly and as deeply as possible, raising the piston or the ball toward the top of the column. 6. Hold your breath for 3-5 seconds or for as long as possible. Allow the piston or ball to fall to the bottom of the column. 7. Remove the mouthpiece from your mouth and breathe out normally. 8. Rest for a few seconds and repeat Steps 1 through 7 at least 10 times every 1-2 hours when you are awake. Take your time and take a few normal breaths between  deep breaths. 9. The spirometer may include an indicator to show your best effort. Use the indicator as a goal to work toward during each repetition. 10. After each set of 10 deep breaths, practice coughing to be sure your lungs are clear. If you have an incision (the cut made at the time of  surgery), support your incision when coughing by placing a pillow or rolled up towels firmly against it. Once you are able to get out of bed, walk around indoors and cough well. You may stop using the incentive spirometer when instructed by your caregiver.  RISKS AND COMPLICATIONS  Take your time so you do not get dizzy or light-headed.  If you are in pain, you may need to take or ask for pain medication before doing incentive spirometry. It is harder to take a deep breath if you are having pain. AFTER USE  Rest and breathe slowly and easily.  It can be helpful to keep track of a log of your progress. Your caregiver can provide you with a simple table to help with this. If you are using the spirometer at home, follow these instructions: [place] IF:   You are having difficultly using the spirometer.  You have trouble using the spirometer as often as instructed.  Your pain medication is not giving enough relief while using the spirometer.  You develop fever of 100.5 F (38.1 C) or higher. SEEK IMMEDIATE MEDICAL CARE IF:   You cough up bloody sputum that had not been present before.  You develop fever of 102 F (38.9 C) or greater.  You develop worsening pain at or near the incision site. MAKE SURE YOU:   Understand these instructions.  Will watch your condition.  Will get help right away if you are not doing well or get worse. Document Released: 04/06/2007 Document Revised: 02/16/2012 Document Reviewed: 06/07/2007 [facility] Patient Information 2014 [place], [place].   ________________________________________________________________________

## 2015-04-18 ENCOUNTER — Encounter (HOSPITAL_COMMUNITY): Payer: Self-pay

## 2015-04-18 ENCOUNTER — Encounter (HOSPITAL_COMMUNITY)
Admission: RE | Admit: 2015-04-18 | Discharge: 2015-04-18 | Disposition: A | Discharge status: Home / Self Care | Payer: Medicare Other | Source: Ambulatory Visit | Attending: Orthopedic Surgery | Admitting: Orthopedic Surgery

## 2015-04-18 DIAGNOSIS — Z01812 Encounter for preprocedural laboratory examination: Secondary | ICD-10-CM | POA: Insufficient documentation

## 2015-04-18 DIAGNOSIS — Z01810 Encounter for preprocedural cardiovascular examination: Secondary | ICD-10-CM | POA: Diagnosis not present

## 2015-04-18 DIAGNOSIS — M71551 Other bursitis, not elsewhere classified, right hip: Secondary | ICD-10-CM | POA: Diagnosis not present

## 2015-04-18 HISTORY — DX: Unspecified glaucoma: H40.9

## 2015-04-18 LAB — BASIC METABOLIC PANEL
ANION GAP: 5 (ref 5–15)
BUN: 24 mg/dL — ABNORMAL HIGH (ref 6–20)
CHLORIDE: 106 mmol/L (ref 101–111)
CO2: 28 mmol/L (ref 22–32)
Calcium: 9.1 mg/dL (ref 8.9–10.3)
Creatinine, Ser: 1.37 mg/dL — ABNORMAL HIGH (ref 0.44–1.00)
GFR calc non Af Amer: 32 mL/min — ABNORMAL LOW (ref >60)
GFR, EST AFRICAN AMERICAN: 37 mL/min — AB (ref >60)
Glucose, Bld: 95 mg/dL (ref 70–99)
Potassium: 4.7 mmol/L (ref 3.5–5.1)
SODIUM: 139 mmol/L (ref 135–145)

## 2015-04-18 LAB — CBC
HCT: 37.4 % (ref 36.0–46.0)
Hemoglobin: 11.8 g/dL — ABNORMAL LOW (ref 12.0–15.0)
MCH: 29.8 pg (ref 26.0–34.0)
MCHC: 31.6 g/dL (ref 30.0–36.0)
MCV: 94.4 fL (ref 78.0–100.0)
Platelets: 194 10*3/uL (ref 150–400)
RBC: 3.96 MIL/uL (ref 3.87–5.11)
RDW: 13 % (ref 11.5–15.5)
WBC: 5.5 10*3/uL (ref 4.0–10.5)

## 2015-04-19 NOTE — Pre-Procedure Instructions (Signed)
04-19-15 1710 BMP result faxed to Dr. Deri FuellingAluisio's office.

## 2015-04-19 NOTE — Progress Notes (Signed)
04-19-15 1710 labs viewable in Epic 04-18-15 please note BMP.

## 2015-04-23 ENCOUNTER — Observation Stay (HOSPITAL_COMMUNITY)
Admission: RE | Admit: 2015-04-23 | Discharge: 2015-04-24 | Disposition: A | Discharge status: Home / Self Care | Payer: Medicare Other | Source: Ambulatory Visit | Attending: Orthopedic Surgery | Admitting: Orthopedic Surgery

## 2015-04-23 ENCOUNTER — Encounter (HOSPITAL_COMMUNITY)
Admission: RE | Disposition: A | Discharge status: Home / Self Care | Payer: Self-pay | Source: Ambulatory Visit | Attending: Orthopedic Surgery

## 2015-04-23 ENCOUNTER — Ambulatory Visit (HOSPITAL_COMMUNITY): Payer: Medicare Other | Admitting: Anesthesiology

## 2015-04-23 ENCOUNTER — Encounter (HOSPITAL_COMMUNITY): Payer: Self-pay | Admitting: *Deleted

## 2015-04-23 DIAGNOSIS — M7061 Trochanteric bursitis, right hip: Principal | ICD-10-CM | POA: Insufficient documentation

## 2015-04-23 DIAGNOSIS — Z7982 Long term (current) use of aspirin: Secondary | ICD-10-CM | POA: Insufficient documentation

## 2015-04-23 DIAGNOSIS — E785 Hyperlipidemia, unspecified: Secondary | ICD-10-CM | POA: Insufficient documentation

## 2015-04-23 DIAGNOSIS — K219 Gastro-esophageal reflux disease without esophagitis: Secondary | ICD-10-CM | POA: Insufficient documentation

## 2015-04-23 DIAGNOSIS — Z886 Allergy status to analgesic agent status: Secondary | ICD-10-CM | POA: Diagnosis not present

## 2015-04-23 DIAGNOSIS — E039 Hypothyroidism, unspecified: Secondary | ICD-10-CM | POA: Insufficient documentation

## 2015-04-23 DIAGNOSIS — M898X8 Other specified disorders of bone, other site: Secondary | ICD-10-CM | POA: Diagnosis not present

## 2015-04-23 DIAGNOSIS — J45909 Unspecified asthma, uncomplicated: Secondary | ICD-10-CM | POA: Insufficient documentation

## 2015-04-23 DIAGNOSIS — E559 Vitamin D deficiency, unspecified: Secondary | ICD-10-CM | POA: Diagnosis not present

## 2015-04-23 DIAGNOSIS — I10 Essential (primary) hypertension: Secondary | ICD-10-CM | POA: Diagnosis not present

## 2015-04-23 DIAGNOSIS — Z888 Allergy status to other drugs, medicaments and biological substances status: Secondary | ICD-10-CM | POA: Insufficient documentation

## 2015-04-23 HISTORY — PX: EXCISION/RELEASE BURSA HIP: SHX5014

## 2015-04-23 SURGERY — RELEASE, BURSA, TROCHANTERIC
Anesthesia: General | Site: Hip | Laterality: Right

## 2015-04-23 MED ORDER — HYDRALAZINE HCL 20 MG/ML IJ SOLN
INTRAMUSCULAR | Status: AC
  Filled 2015-04-23: qty 1

## 2015-04-23 MED ORDER — BUPIVACAINE LIPOSOME 1.3 % IJ SUSP
20.0000 mL | Freq: Once | INTRAMUSCULAR | Status: DC
  Filled 2015-04-23: qty 20

## 2015-04-23 MED ORDER — SODIUM CHLORIDE 0.9 % IV SOLN
INTRAVENOUS | Status: DC
  Administered 2015-04-23: 22:00:00 via INTRAVENOUS

## 2015-04-23 MED ORDER — ENOXAPARIN SODIUM 30 MG/0.3ML ~~LOC~~ SOLN
30.0000 mg | SUBCUTANEOUS | Status: DC
  Administered 2015-04-24: 30 mg via SUBCUTANEOUS
  Filled 2015-04-23 (×2): qty 0.3

## 2015-04-23 MED ORDER — ALBUTEROL SULFATE (2.5 MG/3ML) 0.083% IN NEBU: 3.0000 mL | INHALATION_SOLUTION | Freq: Four times a day (QID) | RESPIRATORY_TRACT | Status: DC | PRN

## 2015-04-23 MED ORDER — ASPIRIN EC 81 MG PO TBEC
81.0000 mg | DELAYED_RELEASE_TABLET | Freq: Every morning | ORAL | Status: DC
  Administered 2015-04-24: 81 mg via ORAL
  Filled 2015-04-23: qty 1

## 2015-04-23 MED ORDER — ONDANSETRON HCL 4 MG/2ML IJ SOLN
INTRAMUSCULAR | Status: AC
  Filled 2015-04-23: qty 2

## 2015-04-23 MED ORDER — DARIFENACIN HYDROBROMIDE ER 7.5 MG PO TB24
7.5000 mg | ORAL_TABLET | Freq: Every day | ORAL | Status: DC
  Administered 2015-04-23 – 2015-04-24 (×2): 7.5 mg via ORAL
  Filled 2015-04-23 (×2): qty 1

## 2015-04-23 MED ORDER — ACETAMINOPHEN 500 MG PO TABS
1000.0000 mg | ORAL_TABLET | Freq: Four times a day (QID) | ORAL | Status: DC
  Administered 2015-04-23 – 2015-04-24 (×2): 1000 mg via ORAL
  Filled 2015-04-23 (×4): qty 2

## 2015-04-23 MED ORDER — ACETAMINOPHEN 650 MG RE SUPP: 650.0000 mg | Freq: Four times a day (QID) | RECTAL | Status: DC | PRN

## 2015-04-23 MED ORDER — SODIUM CHLORIDE 0.9 % IJ SOLN
INTRAMUSCULAR | Status: AC
  Filled 2015-04-23: qty 50

## 2015-04-23 MED ORDER — FENTANYL CITRATE (PF) 100 MCG/2ML IJ SOLN
25.0000 ug | INTRAMUSCULAR | Status: DC | PRN
  Administered 2015-04-23: 50 ug via INTRAVENOUS
  Administered 2015-04-23: 25 ug via INTRAVENOUS

## 2015-04-23 MED ORDER — PROPOFOL 10 MG/ML IV BOLUS
INTRAVENOUS | Status: DC | PRN
  Administered 2015-04-23: 100 mg via INTRAVENOUS

## 2015-04-23 MED ORDER — PROPOFOL 10 MG/ML IV BOLUS
INTRAVENOUS | Status: AC
  Filled 2015-04-23: qty 20

## 2015-04-23 MED ORDER — LATANOPROST 0.005 % OP SOLN
1.0000 [drp] | Freq: Every day | OPHTHALMIC | Status: DC
  Administered 2015-04-23: 1 [drp] via OPHTHALMIC
  Filled 2015-04-23: qty 2.5

## 2015-04-23 MED ORDER — METOCLOPRAMIDE HCL 10 MG PO TABS: 5.0000 mg | ORAL_TABLET | Freq: Three times a day (TID) | ORAL | Status: DC | PRN

## 2015-04-23 MED ORDER — BUPIVACAINE HCL 0.25 % IJ SOLN
INTRAMUSCULAR | Status: DC | PRN
  Administered 2015-04-23: 30 mL via INTRA_ARTICULAR

## 2015-04-23 MED ORDER — CEFAZOLIN SODIUM-DEXTROSE 2-3 GM-% IV SOLR
INTRAVENOUS | Status: AC
  Filled 2015-04-23: qty 50

## 2015-04-23 MED ORDER — FENTANYL CITRATE (PF) 100 MCG/2ML IJ SOLN
INTRAMUSCULAR | Status: AC
  Filled 2015-04-23: qty 2

## 2015-04-23 MED ORDER — BUPIVACAINE HCL (PF) 0.25 % IJ SOLN
INTRAMUSCULAR | Status: AC
  Filled 2015-04-23: qty 30

## 2015-04-23 MED ORDER — SODIUM CHLORIDE 0.9 % IV SOLN: INTRAVENOUS | Status: DC

## 2015-04-23 MED ORDER — ONDANSETRON HCL 4 MG/2ML IJ SOLN: 4.0000 mg | Freq: Four times a day (QID) | INTRAMUSCULAR | Status: DC | PRN

## 2015-04-23 MED ORDER — LEVOTHYROXINE SODIUM 100 MCG PO TABS
100.0000 ug | ORAL_TABLET | Freq: Every day | ORAL | Status: DC
  Administered 2015-04-24: 100 ug via ORAL
  Filled 2015-04-23 (×2): qty 1

## 2015-04-23 MED ORDER — FLUTICASONE PROPIONATE 50 MCG/ACT NA SUSP
2.0000 | Freq: Every day | NASAL | Status: DC | PRN
Start: 2015-04-23 — End: 2015-04-24
  Administered 2015-04-24: 2 via NASAL
  Filled 2015-04-23: qty 16

## 2015-04-23 MED ORDER — METOCLOPRAMIDE HCL 5 MG/ML IJ SOLN: 5.0000 mg | Freq: Three times a day (TID) | INTRAMUSCULAR | Status: DC | PRN

## 2015-04-23 MED ORDER — ACETAMINOPHEN 10 MG/ML IV SOLN
1000.0000 mg | Freq: Once | INTRAVENOUS | Status: AC
  Administered 2015-04-23: 1000 mg via INTRAVENOUS
  Filled 2015-04-23: qty 100

## 2015-04-23 MED ORDER — TIMOLOL MALEATE 0.5 % OP SOLG
1.0000 [drp] | Freq: Every morning | OPHTHALMIC | Status: DC
  Administered 2015-04-24: 1 [drp] via OPHTHALMIC
  Filled 2015-04-23: qty 5

## 2015-04-23 MED ORDER — LACTATED RINGERS IV SOLN: INTRAVENOUS | Status: DC

## 2015-04-23 MED ORDER — FENTANYL CITRATE (PF) 100 MCG/2ML IJ SOLN
INTRAMUSCULAR | Status: DC | PRN
  Administered 2015-04-23 (×2): 25 ug via INTRAVENOUS

## 2015-04-23 MED ORDER — ONDANSETRON HCL 4 MG PO TABS: 4.0000 mg | ORAL_TABLET | Freq: Four times a day (QID) | ORAL | Status: DC | PRN

## 2015-04-23 MED ORDER — METHOCARBAMOL 500 MG PO TABS
500.0000 mg | ORAL_TABLET | Freq: Four times a day (QID) | ORAL | Status: DC | PRN
  Administered 2015-04-23: 500 mg via ORAL
  Filled 2015-04-23: qty 1

## 2015-04-23 MED ORDER — HYDRALAZINE HCL 20 MG/ML IJ SOLN
10.0000 mg | INTRAMUSCULAR | Status: DC | PRN
  Administered 2015-04-23: 10 mg via INTRAVENOUS

## 2015-04-23 MED ORDER — CEFAZOLIN SODIUM-DEXTROSE 2-3 GM-% IV SOLR
2.0000 g | Freq: Four times a day (QID) | INTRAVENOUS | Status: AC
  Administered 2015-04-23 – 2015-04-24 (×3): 2 g via INTRAVENOUS
  Filled 2015-04-23 (×3): qty 50

## 2015-04-23 MED ORDER — ONDANSETRON HCL 4 MG/2ML IJ SOLN
INTRAMUSCULAR | Status: DC | PRN
  Administered 2015-04-23: 4 mg via INTRAVENOUS

## 2015-04-23 MED ORDER — DEXAMETHASONE SODIUM PHOSPHATE 10 MG/ML IJ SOLN
10.0000 mg | Freq: Once | INTRAMUSCULAR | Status: AC
  Administered 2015-04-23: 10 mg via INTRAVENOUS

## 2015-04-23 MED ORDER — TRAMADOL HCL 50 MG PO TABS
50.0000 mg | ORAL_TABLET | Freq: Four times a day (QID) | ORAL | Status: DC | PRN
Start: 2015-04-23 — End: 2015-04-24
  Administered 2015-04-24 (×2): 50 mg via ORAL
  Filled 2015-04-23: qty 2
  Filled 2015-04-23: qty 1

## 2015-04-23 MED ORDER — CHLORHEXIDINE GLUCONATE 4 % EX LIQD: 60.0000 mL | Freq: Once | CUTANEOUS | Status: DC

## 2015-04-23 MED ORDER — METHOCARBAMOL 1000 MG/10ML IJ SOLN: 500.0000 mg | Freq: Four times a day (QID) | INTRAVENOUS | Status: DC | PRN

## 2015-04-23 MED ORDER — CEFAZOLIN SODIUM-DEXTROSE 2-3 GM-% IV SOLR
2.0000 g | INTRAVENOUS | Status: AC
  Administered 2015-04-23: 2 g via INTRAVENOUS

## 2015-04-23 MED ORDER — ACETAMINOPHEN 325 MG PO TABS: 650.0000 mg | ORAL_TABLET | Freq: Four times a day (QID) | ORAL | Status: DC | PRN

## 2015-04-23 MED ORDER — LACTATED RINGERS IV SOLN
INTRAVENOUS | Status: DC | PRN
  Administered 2015-04-23: 16:00:00 via INTRAVENOUS

## 2015-04-23 SURGICAL SUPPLY — 43 items
BAG SPEC THK2 15X12 ZIP CLS (MISCELLANEOUS)
BAG ZIPLOCK 12X15 (MISCELLANEOUS) IMPLANT
BIT DRILL 2.4X128 (BIT) IMPLANT
BIT DRILL 2.4X128MM (BIT)
BLADE EXTENDED COATED 6.5IN (ELECTRODE) ×3 IMPLANT
CLOSURE WOUND 1/2 X4 (GAUZE/BANDAGES/DRESSINGS) ×1
DRAPE INCISE IOBAN 66X45 STRL (DRAPES) ×3 IMPLANT
DRAPE ORTHO SPLIT 77X108 STRL (DRAPES) ×6
DRAPE POUCH INSTRU U-SHP 10X18 (DRAPES) ×3 IMPLANT
DRAPE SURG ORHT 6 SPLT 77X108 (DRAPES) ×2 IMPLANT
DRAPE U-SHAPE 47X51 STRL (DRAPES) ×3 IMPLANT
DRSG ADAPTIC 3X8 NADH LF (GAUZE/BANDAGES/DRESSINGS) ×3 IMPLANT
DRSG MEPILEX BORDER 4X4 (GAUZE/BANDAGES/DRESSINGS) IMPLANT
DRSG MEPILEX BORDER 4X8 (GAUZE/BANDAGES/DRESSINGS) ×3 IMPLANT
DURAPREP 26ML APPLICATOR (WOUND CARE) ×3 IMPLANT
ELECT REM PT RETURN 9FT ADLT (ELECTROSURGICAL) ×3
ELECTRODE REM PT RTRN 9FT ADLT (ELECTROSURGICAL) ×1 IMPLANT
GAUZE SPONGE 4X4 12PLY STRL (GAUZE/BANDAGES/DRESSINGS) ×3 IMPLANT
GLOVE BIO SURGEON STRL SZ7.5 (GLOVE) ×3 IMPLANT
GLOVE BIO SURGEON STRL SZ8 (GLOVE) ×3 IMPLANT
GLOVE BIOGEL PI IND STRL 8 (GLOVE) ×2 IMPLANT
GLOVE BIOGEL PI INDICATOR 8 (GLOVE) ×4
GOWN STRL REUS W/TWL LRG LVL3 (GOWN DISPOSABLE) ×3 IMPLANT
GOWN STRL REUS W/TWL XL LVL3 (GOWN DISPOSABLE) ×3 IMPLANT
KIT BASIN OR (CUSTOM PROCEDURE TRAY) ×3 IMPLANT
MANIFOLD NEPTUNE II (INSTRUMENTS) ×3 IMPLANT
NDL SAFETY ECLIPSE 18X1.5 (NEEDLE) ×2 IMPLANT
NEEDLE HYPO 18GX1.5 SHARP (NEEDLE) ×3
NS IRRIG 1000ML POUR BTL (IV SOLUTION) ×3 IMPLANT
PACK TOTAL JOINT (CUSTOM PROCEDURE TRAY) ×3 IMPLANT
PASSER SUT SWANSON 36MM LOOP (INSTRUMENTS) IMPLANT
POSITIONER SURGICAL ARM (MISCELLANEOUS) ×3 IMPLANT
STRIP CLOSURE SKIN 1/2X4 (GAUZE/BANDAGES/DRESSINGS) ×2 IMPLANT
SUT ETHIBOND NAB CT1 #1 30IN (SUTURE) IMPLANT
SUT MNCRL AB 4-0 PS2 18 (SUTURE) ×3 IMPLANT
SUT VIC AB 1 CT1 27 (SUTURE) ×6
SUT VIC AB 1 CT1 27XBRD ANTBC (SUTURE) ×2 IMPLANT
SUT VIC AB 2-0 CT1 27 (SUTURE) ×6
SUT VIC AB 2-0 CT1 TAPERPNT 27 (SUTURE) ×2 IMPLANT
SUT VLOC 180 0 24IN GS25 (SUTURE) ×2 IMPLANT
SYR 20CC LL (SYRINGE) ×3 IMPLANT
SYR 50ML LL SCALE MARK (SYRINGE) ×1 IMPLANT
TOWEL OR 17X26 10 PK STRL BLUE (TOWEL DISPOSABLE) ×3 IMPLANT

## 2015-04-23 NOTE — Transfer of Care (Signed)
Immediate Anesthesia Transfer of Care Note  Patient: Kelly Li  Procedure(s) Performed: Procedure(s): RIGHT HIP BURSECTOMY, EXCISION OF HETEROTOPIC BONE  (Right)  Patient Location: PACU  Anesthesia Type:General  Level of Consciousness:  sedated, patient cooperative and responds to stimulation  Airway & Oxygen Therapy:Patient Spontanous Breathing and Patient connected to face mask oxgen  Post-op Assessment:  Report given to PACU RN and Post -op Vital signs reviewed and stable  Post vital signs:  Reviewed and stable  Last Vitals: There were no vitals filed for this visit.  Complications: No apparent anesthesia complications

## 2015-04-23 NOTE — Anesthesia Procedure Notes (Signed)
Procedure Name: LMA Insertion Date/Time: 04/23/2015 5:05 PM Performed by: Paris LoreBLANTON, Kelly Thorington M Pre-anesthesia Checklist: Patient identified, Emergency Drugs available, Suction available, Patient being monitored and Timeout performed Patient Re-evaluated:Patient Re-evaluated prior to inductionOxygen Delivery Method: Circle system utilized Preoxygenation: Pre-oxygenation with 100% oxygen Intubation Type: IV induction Ventilation: Mask ventilation without difficulty LMA: LMA inserted LMA Size: 4.0 Number of attempts: 1 Placement Confirmation: positive ETCO2 and breath sounds checked- equal and bilateral Tube secured with: Tape

## 2015-04-23 NOTE — Interval H&P Note (Signed)
History and Physical Interval Note:  04/23/2015 4:27 PM  Kelly Li  has presented today for surgery, with the diagnosis of RIGHT HIP BURSITIS  The various methods of treatment have been discussed with the patient and family. After consideration of risks, benefits and other options for treatment, the patient has consented to  Procedure(s): RIGHT HIP BURSECTOMY, EXCISION OF HETEROTOPIC BONE  (Right) as a surgical intervention .  The patient's history has been reviewed, patient examined, no change in status, stable for surgery.  I have reviewed the patient's chart and labs.  Questions were answered to the patient's satisfaction.     Loanne DrillingALUISIO,Abie Killian V

## 2015-04-23 NOTE — Anesthesia Preprocedure Evaluation (Addendum)
Anesthesia Evaluation  Patient identified by MRN, date of birth, ID band Patient awake    Reviewed: Allergy & Precautions, H&P , NPO status , Patient's Chart, lab work & pertinent test results  Airway Mallampati: II  TM Distance: >3 FB Neck ROM: full    Dental  (+) Dental Advisory Given, Missing A couple of front upper teeth missing:   Pulmonary asthma ,  breath sounds clear to auscultation  Pulmonary exam normal       Cardiovascular hypertension, Normal cardiovascular examRhythm:regular Rate:Normal     Neuro/Psych glaucoma negative neurological ROS  negative psych ROS   GI/Hepatic negative GI ROS, Neg liver ROS, GERD-  Medicated and Controlled,  Endo/Other  negative endocrine ROSHypothyroidism   Renal/GU negative Renal ROS  negative genitourinary   Musculoskeletal   Abdominal   Peds  Hematology negative hematology ROS (+)   Anesthesia Other Findings   Reproductive/Obstetrics negative OB ROS                            Anesthesia Physical Anesthesia Plan  ASA: II  Anesthesia Plan: General   Post-op Pain Management:    Induction: Intravenous  Airway Management Planned: LMA  Additional Equipment:   Intra-op Plan:   Post-operative Plan:   Informed Consent: I have reviewed the patients History and Physical, chart, labs and discussed the procedure including the risks, benefits and alternatives for the proposed anesthesia with the patient or authorized representative who has indicated his/her understanding and acceptance.   Dental Advisory Given  Plan Discussed with: CRNA and Surgeon  Anesthesia Plan Comments:         Anesthesia Quick Evaluation

## 2015-04-23 NOTE — H&P (Signed)
CC- Kelly Li is a 81 y.o. female who presents with right hip pain  Hip Pain: Patient complains of right hip pain. Onset of the symptoms was several months ago. Inciting event: none. Current symptoms include lateral hip pain. Associated symptoms: none. Aggravating symptoms: any weight bearing, inactivity and laying on right side. Patient's course of pain: gradually worsening. Patient has had prior hip problems.Evaluation to date: plain films, which were showing right hip hemiarthroplasty in good position with no periprosthetic abnormalities. 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  Treatment to date: injection which did not help.  Past Medical History  Diagnosis Date  . Asthma   . Unspecified hypothyroidism   . Hypertension   . GERD (gastroesophageal reflux disease)   . Vitamin D deficiency   . Hyperlipidemia   . Renal insufficiency   . DJD (degenerative joint disease)     right hip and back  . Glaucoma   . Mucus pooling in larynx     frequent occassions of mucus coming up intermittently in back of throat bothers pt.    Past Surgical History  Procedure Laterality Date  . Hip surgery    . Thyroid surgery    . Esophagogastroduodenoscopy  2009  . Nephrectomy  2003  . Colonoscopy  2003  . Breast biopsy Right 1997  . Cataract extraction, bilateral Bilateral     Prior to Admission medications   Medication Sig Start Date End Date Taking? Authorizing Provider  acetaminophen (TYLENOL) 650 MG CR tablet Take 650 mg by mouth every 8 (eight) hours as needed for pain.    Yes Historical Provider, MD  albuterol (PROVENTIL HFA;VENTOLIN HFA) 108 (90 BASE) MCG/ACT inhaler Inhale 2 puffs into the lungs every 6 (six) hours as needed. Shortness of breath 04/24/14  Yes Quentin Mullingmanda Collier, PA-C  aspirin EC 81 MG tablet Take 81 mg by mouth every morning.    Yes Historical Provider, MD  bimatoprost (LUMIGAN) 0.01 % SOLN Place 1  drop into both eyes at bedtime.   Yes Historical Provider, MD  cholecalciferol (VITAMIN D) 1000 UNITS tablet Take 2,000 Units by mouth every morning.    Yes Historical Provider, MD  fluticasone (FLONASE) 50 MCG/ACT nasal spray Place 2 sprays into both nostrils daily as needed for allergies or rhinitis.    Yes Historical Provider, MD  latanoprost (XALATAN) 0.005 % ophthalmic solution Place 1 drop into the left eye at bedtime. 03/20/15  Yes Historical Provider, MD  levothyroxine (SYNTHROID, LEVOTHROID) 100 MCG tablet TAKE 1 TABLET BY MOUTH EVERY DAY 09/18/14  Yes Lucky CowboyWilliam McKeown, MD  pantoprazole (PROTONIX) 40 MG tablet TAKE 1 TABLET EVERY DAY FOR ACID REFLUX Patient taking differently: TAKE 1 TABLET EVERY DAY FOR ACID REFLUX(pt no longer taking) 04/18/14  Yes Lucky CowboyWilliam McKeown, MD  ranitidine (ZANTAC) 300 MG tablet Take twice a day while trying to get off PPI, then can go to once at night Patient taking differently: Take 300 mg by mouth 2 (two) times daily.  02/01/15  Yes Quentin MullingAmanda Collier, PA-C  timolol (TIMOPTIC-XR) 0.5 % ophthalmic gel-forming Place 1 drop into the left eye every morning. 03/19/15  Yes Historical Provider, MD  VESICARE 5 MG tablet TAKE 1 TABLET BY MOUTH EVERY DAY FOR OVER ACTIVE BLADDER 07/18/14  Yes Lucky CowboyWilliam McKeown, MD    Physical Examination: General appearance - alert, well appearing, and in no distress Mental status - alert, oriented to person, place, and time Chest - clear  to auscultation, no wheezes, rales or rhonchi, symmetric air entry Heart - normal rate, regular rhythm, normal S1, S2, no murmurs, rubs, clicks or gallops Abdomen - soft, nontender, nondistended, no masses or organomegaly Neurological - alert, oriented, normal speech, no focal findings or movement disorder noted  A right hip exam was performed. GENERAL: no acute distress SKIN: intact SWELLING: none WARMTH: no warmth TENDERNESS: maximal at greater trochanter ROM: normal STRENGTH: normal  ASSESSMENT: Right  hip intractable bursitis  Plan Right hip bursectomy with excision of heterotopic bone. Discussed in detail with the patient who elects to proceed.  Gus RankinFrank V. Bryttney Netzer, MD    04/23/2015, 4:22 PM

## 2015-04-23 NOTE — Anesthesia Postprocedure Evaluation (Signed)
  Anesthesia Post-op Note  Patient: Kelly Li  Procedure(s) Performed: Procedure(s) (LRB): RIGHT HIP BURSECTOMY, EXCISION OF HETEROTOPIC BONE  (Right)  Patient Location: PACU  Anesthesia Type: General  Level of Consciousness: awake and alert   Airway and Oxygen Therapy: Patient Spontanous Breathing  Post-op Pain: mild  Post-op Assessment: Post-op Vital signs reviewed, Patient's Cardiovascular Status Stable, Respiratory Function Stable, Patent Airway and No signs of Nausea or vomiting  Last Vitals:  Filed Vitals:   04/23/15 1840  BP:   Pulse: 58  Temp:   Resp: 9    Post-op Vital Signs: stable   Complications: No apparent anesthesia complications

## 2015-04-23 NOTE — Brief Op Note (Signed)
04/23/2015  5:41 PM  PATIENT:  Linus OrnViola Y Kaeding  81 y.o. female  PRE-OPERATIVE DIAGNOSIS:  RIGHT HIP BURSITIS  POST-OPERATIVE DIAGNOSIS:  RIGHT HIP BURSITIS  PROCEDURE:  Procedure(s): RIGHT HIP BURSECTOMY, EXCISION OF HETEROTOPIC BONE  (Right)  SURGEON:  Surgeon(s) and Role:    * Ollen GrossFrank Jakyron Fabro, MD - Primary  PHYSICIAN ASSISTANT:   ASSISTANTS: Avel Peacerew Perkins, PA-C   ANESTHESIA:   general  EBL:     BLOOD ADMINISTERED:none  DRAINS: none   LOCAL MEDICATIONS USED:  MARCAINE     COUNTS:  YES  TOURNIQUET:  * No tourniquets in log *  DICTATION: .Other Dictation: Dictation Number [id number]  PLAN OF CARE: Admit for overnight observation  PATIENT DISPOSITION:  PACU - hemodynamically stable.

## 2015-04-24 ENCOUNTER — Encounter (HOSPITAL_COMMUNITY): Payer: Self-pay | Admitting: Orthopedic Surgery

## 2015-04-24 DIAGNOSIS — M7061 Trochanteric bursitis, right hip: Secondary | ICD-10-CM | POA: Diagnosis not present

## 2015-04-24 MED ORDER — TRAMADOL HCL 50 MG PO TABS: 50.0000 mg | ORAL_TABLET | Freq: Four times a day (QID) | ORAL | Status: DC | PRN

## 2015-04-24 MED ORDER — METHOCARBAMOL 500 MG PO TABS: 500.0000 mg | ORAL_TABLET | Freq: Four times a day (QID) | ORAL | Status: DC | PRN

## 2015-04-24 NOTE — Discharge Summary (Signed)
Physician Discharge Summary   Patient ID: Kelly Li MRN: [id number] DOB/AGE: Apr 16, 1917 81 y.o.  Admit date: 04/23/2015 Discharge date: 04-24-2015  Primary Diagnosis:  RIGHT HIP BURSITIS  Admission Diagnoses:  Past Medical History  Diagnosis Date  . Asthma   . Unspecified hypothyroidism   . Hypertension   . GERD (gastroesophageal reflux disease)   . Vitamin D deficiency   . Hyperlipidemia   . Renal insufficiency   . DJD (degenerative joint disease)     right hip and back  . Glaucoma   . Mucus pooling in larynx     frequent occassions of mucus coming up intermittently in back of throat bothers pt.   Discharge Diagnoses:   Principal Problem:   Trochanteric bursitis of right hip Active Problems:   Hip bursitis  Estimated body mass index is 21.64 kg/(m^2) as calculated from the following:   Height as of this encounter: $RemoveBeforeD'5\' 6"'ofawHWSzNHhRLE$  (1.676 m).   Weight as of this encounter: 60.782 kg (134 lb).  Procedure(s) (LRB): RIGHT HIP BURSECTOMY, EXCISION OF HETEROTOPIC BONE  (Right)   Consults: None  HPI: Kelly Li is a 81 year old female who had a right hip hemiarthroplasty done many years ago. She recently has developed severe intractable right lateral hip pain. Workup with x-rays and MRI was negative for any kind of fluid collection or any kind of loosening. 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 She has developed significant bursitis from this. She presents now for bursectomy and excision of heterotopic bone.  Laboratory Data: Hospital Outpatient Visit on 04/18/2015  Component Date Value Ref Range Status  . WBC 04/18/2015 5.5  4.0 - 10.5 K/uL Final  . RBC 04/18/2015 3.96  3.87 - 5.11 MIL/uL Final  . Hemoglobin 04/18/2015 11.8* 12.0 - 15.0 g/dL Final  . HCT 04/18/2015 37.4  36.0 - 46.0 % Final  . MCV 04/18/2015 94.4  78.0 - 100.0 fL Final  . MCH 04/18/2015 29.8  26.0 - 34.0  pg Final  . MCHC 04/18/2015 31.6  30.0 - 36.0 g/dL Final  . RDW 04/18/2015 13.0  11.5 - 15.5 % Final  . Platelets 04/18/2015 194  150 - 400 K/uL Final  . Sodium 04/18/2015 139  135 - 145 mmol/L Final   REPEATED TO VERIFY  . Potassium 04/18/2015 4.7  3.5 - 5.1 mmol/L Final   REPEATED TO VERIFY  . Chloride 04/18/2015 106  101 - 111 mmol/L Final   REPEATED TO VERIFY  . CO2 04/18/2015 28  22 - 32 mmol/L Final   REPEATED TO VERIFY  . Glucose, Bld 04/18/2015 95  70 - 99 mg/dL Final  . BUN 04/18/2015 24* 6 - 20 mg/dL Final  . Creatinine, Ser 04/18/2015 1.37* 0.44 - 1.00 mg/dL Final  . Calcium 04/18/2015 9.1  8.9 - 10.3 mg/dL Final   REPEATED TO VERIFY  . GFR calc non Af Amer 04/18/2015 32* >60 mL/min Final  . GFR calc Af Amer 04/18/2015 37* >60 mL/min Final   Comment: (NOTE) The eGFR has been calculated using the CKD EPI equation. This calculation has not been validated in all clinical situations. eGFR's persistently <60 mL/min signify possible Chronic Kidney Disease.   . Anion gap 04/18/2015 5  5 - 15 Final     X-Rays:No results found.  EKG: Orders placed or performed during the hospital encounter of 04/18/15  . EKG 12-Lead  . EKG 12-Lead  Hospital Course: Patient was admitted to [facility] and taken to the OR and underwent the above state procedure without complications.  Patient tolerated the procedure well and was later transferred to the recovery room and then to the orthopaedic floor for postoperative care.  They were given PO and IV analgesics for pain control following their surgery.  They were given 24 hours of postoperative antibiotics of  Anti-infectives    Start     Dose/Rate Route Frequency Ordered Stop   04/23/15 2300  ceFAZolin (ANCEF) IVPB 2 g/50 mL premix     2 g 100 mL/hr over 30 Minutes Intravenous Every 6 hours 04/23/15 1958 04/24/15 1659   04/23/15 1515  ceFAZolin (ANCEF) IVPB 2 g/50 mL premix     2 g 100 mL/hr over 30 Minutes Intravenous On call  to O.R. 04/23/15 1503 04/23/15 1710     and started on DVT prophylaxis in the form of Lovenox. Patient was encouraged to get up and ambulate the day after surgery.  The patient was allowed to be WBAT with therapy. Discharge planning was consulted to help with postop disposition and equipment needs.  Patient had a decent night on the evening of surgery.  They started to get up OOB with therapy on day one.   Patient was seen in rounds and was ready to go home following morning ambulation.  Diet - Cardiac diet and Renal diet Follow up - in two weeks. Call office for appointment at [phone]. Activity - Weight bearing as tolerated to the surgical leg.  Walker for first several days until comfortable ambulating. May start showering three days following surgery but do not submerge incision under water. Continue to use ice for pain and swelling from surgery.  Baby Aspirin 81 mg daily for three weeks.  Please use walker for the first couple of days until comfortable ambulating.  May start changing dressing tomorrow with dry gauze and tape.  Disposition - Home Condition Upon Discharge - Good D/C Meds - See DC Summary DVT Prophylaxis - Aspirin   Discharge Instructions    Call MD / Call 911    Complete by:  As directed   If you experience chest pain or shortness of breath, CALL 911 and be transported to the hospital emergency room.  If you develope a fever above 101 F, pus (white drainage) or increased drainage or redness at the wound, or calf pain, call your surgeon's office.     Change dressing    Complete by:  As directed   You may change your dressing dressing daily with sterile 4 x 4 inch gauze dressing and paper tape.  Do not submerge the incision under water.     Constipation Prevention    Complete by:  As directed   Drink plenty of fluids.  Prune juice may be helpful.  You may use a stool softener, such as Colace (over the counter) 100 mg twice a day.  Use MiraLax (over the counter) for  constipation as needed.     Diet - low sodium heart healthy    Complete by:  As directed      Discharge instructions    Complete by:  As directed   Pick up stool softner and laxative for home use following surgery while on pain medications. Do not submerge incision under water. Please use good hand washing techniques while changing dressing each day. May shower starting three days after surgery. Please use a clean towel to pat the incision dry following  showers. Continue to use ice for pain and swelling after surgery. Do not use any lotions or creams on the incision until instructed by your surgeon.  Take Aspirin 81 mg dialy     Do not sit on low chairs, stoools or toilet seats, as it may be difficult to get up from low surfaces    Complete by:  As directed      Driving restrictions    Complete by:  As directed   No driving until released by the physician.     Increase activity slowly as tolerated    Complete by:  As directed      Lifting restrictions    Complete by:  As directed   No lifting until released by the physician.     Patient may shower    Complete by:  As directed   You may shower without a dressing once there is no drainage.  Do not wash over the wound.  If drainage remains, do not shower until drainage stops.     TED hose    Complete by:  As directed   Use stockings (TED hose) for 3 weeks on both leg(s).  You may remove them at night for sleeping.     Weight bearing as tolerated    Complete by:  As directed             Medication List    TAKE these medications        acetaminophen 650 MG CR tablet  Commonly known as:  TYLENOL  Take 650 mg by mouth every 8 (eight) hours as needed for pain.     albuterol 108 (90 BASE) MCG/ACT inhaler  Commonly known as:  PROVENTIL HFA;VENTOLIN HFA  Inhale 2 puffs into the lungs every 6 (six) hours as needed. Shortness of breath     aspirin EC 81 MG tablet  Take 81 mg by mouth every morning.     bimatoprost 0.01 % Soln    Commonly known as:  LUMIGAN  Place 1 drop into both eyes at bedtime.     cholecalciferol 1000 UNITS tablet  Commonly known as:  VITAMIN D  Take 2,000 Units by mouth every morning.     fluticasone 50 MCG/ACT nasal spray  Commonly known as:  FLONASE  Place 2 sprays into both nostrils daily as needed for allergies or rhinitis.     latanoprost 0.005 % ophthalmic solution  Commonly known as:  XALATAN  Place 1 drop into the left eye at bedtime.     levothyroxine 100 MCG tablet  Commonly known as:  SYNTHROID, LEVOTHROID  TAKE 1 TABLET BY MOUTH EVERY DAY     methocarbamol 500 MG tablet  Commonly known as:  ROBAXIN  Take 1 tablet (500 mg total) by mouth every 6 (six) hours as needed for muscle spasms.     pantoprazole 40 MG tablet  Commonly known as:  PROTONIX  TAKE 1 TABLET EVERY DAY FOR ACID REFLUX     ranitidine 300 MG tablet  Commonly known as:  ZANTAC  Take twice a day while trying to get off PPI, then can go to once at night     timolol 0.5 % ophthalmic gel-forming  Commonly known as:  TIMOPTIC-XR  Place 1 drop into the left eye every morning.     traMADol 50 MG tablet  Commonly known as:  ULTRAM  Take 1-2 tablets (50-100 mg total) by mouth every 6 (six) hours as needed for moderate pain.  VESICARE 5 MG tablet  Generic drug:  solifenacin  TAKE 1 TABLET BY MOUTH EVERY DAY FOR OVER ACTIVE BLADDER           Follow-up Information    Follow up with Gearlean Alf, MD. Schedule an appointment as soon as possible for a visit on 05/08/2015.   Specialty:  Orthopedic Surgery   Why:  Call office at [phone] to setup appointment with Dr. Wynelle Link in two weeks on Tuesday 05/08/2015.   Contact information:   [address] [place] [address] [phone]       Signed: Arlee Muslim, PA-C Orthopaedic Surgery 04/24/2015, 9:25 AM

## 2015-04-24 NOTE — Evaluation (Signed)
Physical Therapy One Time Evaluation Patient Details Name: Kelly Li MRN: [id number] DOB: 04/03/1917 Today's Date: 04/24/2015   History of Present Illness  Pt is a 81 year old female with hx of R hip hemiarthroplasty years ago, currently s/p R hip bursectomy and excision of heterotopic bone.  Clinical Impression  Patient evaluated by Physical Therapy with no further acute PT needs identified. All education has been completed and the patient has no further questions.  Pt mobilizing well POD #1 and plans to d/c home today with family assist. See below for any follow-up Physical Therapy or equipment needs. PT is signing off. Thank you for this referral.     Follow Up Recommendations Home health PT;Supervision for mobility/OOB    Equipment Recommendations  None recommended by PT    Recommendations for Other Services       Precautions / Restrictions Precautions Precautions: None Precaution Comments: no precautions - verbal per Dian Situ, [place] Restrictions Weight Bearing Restrictions: No Other Position/Activity Restrictions: WBAT - verbal per Dian Situ, PA      Mobility  Bed Mobility Overal bed mobility: Modified Independent             General bed mobility comments: increased time however pt able to perform without assist, pt self assisted R LE using UEs  Transfers Overall transfer level: Needs assistance Equipment used: Rolling walker (2 wheeled) Transfers: Sit to/from Stand Sit to Stand: Min guard;Supervision         General transfer comment: verbal cues for hand placement  Ambulation/Gait Ambulation/Gait assistance: Min guard;Supervision Ambulation Distance (Feet): 200 Feet Assistive device: Rolling walker (2 wheeled) Gait Pattern/deviations: Step-to pattern     General Gait Details: verbal cues for sequence, RW distance,  educated pt to take more weight through UEs on RW for pain control (reports 5/10 during gait and no pain at rest)  Stairs Stairs: Yes Stairs  assistance: Supervision Stair Management: Forwards;With walker Number of Stairs: 1 General stair comments: verbal cues for safety and sequence  Wheelchair Mobility    Modified Rankin (Stroke Patients Only)       Balance                                             Pertinent Vitals/Pain Pain Assessment: 0-10 Pain Score: 5  Pain Location: R hip  Pain Descriptors / Indicators: Sore Pain Intervention(s): Limited activity within patient's tolerance;Monitored during session;Repositioned;Ice applied    Home Living Family/patient expects to be discharged to:: Private residence Living Arrangements: Alone Available Help at Discharge: Family Type of Home: House Home Access: Stairs to enter Entrance Stairs-Rails: Right Entrance Stairs-Number of Steps: 1 Home Layout: One level Home Equipment: Environmental consultant - 2 wheels      Prior Function Level of Independence: Independent with assistive device(s)               Hand Dominance        Extremity/Trunk Assessment               Lower Extremity Assessment: RLE deficits/detail RLE Deficits / Details: functional hip weakness observed as pt self assist R LE over edge of bed with UEs       Communication   Communication: HOH  Cognition Arousal/Alertness: Awake/alert Behavior During Therapy: WFL for tasks assessed/performed Overall Cognitive Status: Within Functional Limits for tasks assessed  General Comments      Exercises        Assessment/Plan    PT Assessment All further PT needs can be met in the next venue of care  PT Diagnosis Abnormality of gait   PT Problem List Decreased strength;Pain  PT Treatment Interventions     PT Goals (Current goals can be found in the Care Plan section) Acute Rehab PT Goals PT Goal Formulation: All assessment and education complete, DC therapy    Frequency     Barriers to discharge        Co-evaluation                End of Session   Activity Tolerance: Patient tolerated treatment well Patient left: in chair;with call bell/phone within reach;with family/visitor present Nurse Communication: Mobility status    Functional Assessment Tool Used: clinical judgement Functional Limitation: Mobility: Walking and moving around Mobility: Walking and Moving Around Current Status (R0413): At least 1 percent but less than 20 percent impaired, limited or restricted Mobility: Walking and Moving Around Goal Status [phone]): At least 1 percent but less than 20 percent impaired, limited or restricted Mobility: Walking and Moving Around Discharge Status [phone]): At least 1 percent but less than 20 percent impaired, limited or restricted    Time: 0941-0955 PT Time Calculation (min) (ACUTE ONLY): 14 min   Charges:   PT Evaluation $Initial PT Evaluation Tier I: 1 Procedure     PT G Codes:   PT G-Codes **NOT FOR INPATIENT CLASS** Functional Assessment Tool Used: clinical judgement Functional Limitation: Mobility: Walking and moving around Mobility: Walking and Moving Around Current Status (S8864): At least 1 percent but less than 20 percent impaired, limited or restricted Mobility: Walking and Moving Around Goal Status [phone]): At least 1 percent but less than 20 percent impaired, limited or restricted Mobility: Walking and Moving Around Discharge Status [phone]): At least 1 percent but less than 20 percent impaired, limited or restricted    Alycen Mack,KATHrine E 04/24/2015, 12:03 PM Carmelia Bake, PT, DPT 04/24/2015 Pager: [phone]

## 2015-04-24 NOTE — Op Note (Signed)
NAMSonda Rumble:  Schachter, Aerionna                 ACCOUNT NO.:  [id number]  MEDICAL RECORD NO.:  [id number]  LOCATION:  1601                         [facility]  PHYSICIAN:  Ollen GrossFrank Jesusa Stenerson, M.D.    DATE OF BIRTH:  1917/05/14  DATE OF PROCEDURE:  04/23/2015 DATE OF DISCHARGE:                              OPERATIVE REPORT   PREOPERATIVE DIAGNOSIS:  Right hip intractable bursitis with heterotopic bone overlying the greater trochanter.  POSTOPERATIVE DIAGNOSIS:  Right hip intractable bursitis with heterotopic bone overlying the greater trochanter.  PROCEDURE:  Right hip bursectomy with excision of heterotopic bone.  SURGEON:  Ollen GrossFrank Geneieve Duell, M.D.  ASSISTANT:  Alexzandrew L. Perkins, PA-C.  ANESTHESIA:  General.  ESTIMATED BLOOD LOSS:  Minimal.  DRAINS:  None.  COMPLICATIONS:  None.  CONDITION:  Stable to recovery room.  BRIEF CLINICAL NOTE:  Ms. Gomez CleverlyRader is a 81 year old female who had a right hip hemiarthroplasty done many years ago.  She recently has developed severe intractable right lateral hip pain.  Workup with x-rays and MRI was negative for any kind of fluid collection or any kind of loosening. 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  She has developed significant bursitis from this.  She presents now for bursectomy and excision of heterotopic bone.  PROCEDURE IN DETAIL:  After successful administration of general anesthetic, the patient was placed in the left lateral decubitus position with the right side up and held with the hip positioner.  Right lower extremity was isolated from the perineum with drapes and prepped and draped in usual sterile fashion.  About a 4-inch lateral incision was made centered over the greater trochanter.  Skin was cut with a 10- blade through subcutaneous tissue to the fascia lata, which was incised in line with the skin incision.  She has a very  thickened hypertrophic bursa, which was excised.  The gluteal tendons are  attached.  No evidence of any tears.  The heterotopic bone is palpated and it is surrounded by some inflamed tissue.  I was able to excise the large portion of heterotopic bone back to a normal greater trochanter.  I had to incise the fascia of the vastus lateralis in order to get to this. Once completed, I thoroughly irrigated and then closed the fascia of the vastus lateralis with interrupted #1 Vicryl suture.  30 mL of 0.25% Marcaine was injected into the deep tissues, subcu tissues also.  The fascia lata was closed with a running #1 V-Loc suture.  Subcu was closed with interrupted 2-0 Vicryl and subcuticular running 4-0 Monocryl.  The incision was cleaned and dried and Steri-Strips and a bulky sterile dressing applied.  She was then awakened, transported to the recovery in a stable condition.     Ollen GrossFrank Cade Dashner, M.D.     FA/MEDQ  D:  04/23/2015  T:  04/24/2015  Job:  [id number]

## 2015-04-24 NOTE — Progress Notes (Signed)
   Subjective: 1 Day Post-Op Procedure(s) (LRB): RIGHT HIP BURSECTOMY, EXCISION OF HETEROTOPIC BONE  (Right) Patient reports pain as mild.   Patient seen in rounds with Dr. Lequita HaltAluisio.  Family in room. Patient is well, but has had some minor complaints of pain in the hip incision, requiring pain medications Patient is ready to go home following one session of therapy.  Objective: Vital signs in last 24 hours: Temp:  [97.3 F (36.3 C)-97.9 F (36.6 C)] 97.7 F (36.5 C) (05/17 0610) Pulse Rate:  [54-74] 54 (05/17 0610) Resp:  [9-18] 14 (05/17 0610) BP: (100-211)/(37-70) 147/48 mmHg (05/17 0610) SpO2:  [97 %-100 %] 97 % (05/17 0610) Weight:  [60.782 kg (134 lb)] 60.782 kg (134 lb) (05/16 1508)  Intake/Output from previous day:  Intake/Output Summary (Last 24 hours) at 04/24/15 0915 Last data filed at 04/24/15 0734  Gross per 24 hour  Intake 1161.25 ml  Output    825 ml  Net 336.25 ml    Intake/Output this shift: Total I/O In: -  Out: 200 [Urine:200]  EXAM: General - Patient is Alert and Appropriate Extremity - Neurovascular intact Sensation intact distally Dressing - clean, dry Motor Function - intact, moving foot and toes well on exam.   Assessment/Plan: 1 Day Post-Op Procedure(s) (LRB): RIGHT HIP BURSECTOMY, EXCISION OF HETEROTOPIC BONE  (Right) Procedure(s) (LRB): RIGHT HIP BURSECTOMY, EXCISION OF HETEROTOPIC BONE  (Right) Past Medical History  Diagnosis Date  . Asthma   . Unspecified hypothyroidism   . Hypertension   . GERD (gastroesophageal reflux disease)   . Vitamin D deficiency   . Hyperlipidemia   . Renal insufficiency   . DJD (degenerative joint disease)     right hip and back  . Glaucoma   . Mucus pooling in larynx     frequent occassions of mucus coming up intermittently in back of throat bothers pt.   Principal Problem:   Trochanteric bursitis of right hip Active Problems:   Hip bursitis  Estimated body mass index is 21.64 kg/(m^2) as  calculated from the following:   Height as of this encounter: 5\' 6"  (1.676 m).   Weight as of this encounter: 60.782 kg (134 lb). Up with therapy Discharge home Diet - Cardiac diet and Renal diet Follow up - in two weeks. Call office for appointment at [phone]. Activity - Weight bearing as tolerated to the surgical leg. Walker for first several days until comfortable ambulating. May start showering three days following surgery but do not submerge incision under water. Continue to use ice for pain and swelling from surgery.  Baby Aspirin 81 mg daily for three weeks.  Please use walker for the first couple of days until comfortable ambulating.  May start changing dressing tomorrow with dry gauze and tape.  Disposition - Home Condition Upon Discharge - Good D/C Meds - See DC Summary DVT Prophylaxis - Aspirin  Avel Peacerew Curvin Hunger, PA-C Orthopaedic Surgery 04/24/2015, 9:15 AM

## 2015-04-24 NOTE — Discharge Instructions (Signed)
Dr. Ollen Gross [facility] [address], [address] [place], [place] [address] [phone]  Bursectomy / Gluteal Tendon Repair Discharge Instructions  HOME CARE INSTRUCTIONS  Remove items at home which could result in a fall. This includes throw rugs or furniture in walking pathways.   ICE to the affected hip every three hours for 30 minutes at a time and then as needed for pain and swelling.  Continue to use ice on the hip for pain and swelling from surgery. You may notice swelling that will progress down to the foot and ankle.  This is normal after surgery.  Elevate the leg when you are not up walking on it.    Continue to use the breathing machine which will help keep your temperature down.  It is common for your temperature to cycle up and down following surgery, especially at night when you are not up moving around and exerting yourself.  The breathing machine keeps your lungs expanded and your temperature down. Sit on high chairs which makes it easier to stand.  Sit on chairs with arms. Use the chair arms to help push yourself up when arising.   Use your walker for first several days until comfortable ambulating.  DIET You may resume your previous home diet once your are discharged from the hospital.  DRESSING / WOUND CARE / SHOWERING You may change your dressing 3-5 days after surgery.  Then change the dressing every day with sterile gauze.  Please use good hand washing techniques before changing the dressing.  Do not use any lotions or creams on the incision until instructed by your surgeon. and You may shower 3 days after surgery, but keep the wounds dry during showering.  You may use an occlusive plastic wrap (Press'n Seal for example), NO SOAKING/SUBMERGING IN THE BATHTUB.  If the bandage gets wet, change with a clean dry gauze.  If the incision gets wet, pat the wound dry with a clean towel. You may start showering three days following  surgery but do not submerge the incision under water.Just pat the incision dry and apply a dry gauze dressing on daily. Change the surgical dressing daily and reapply a dry dressing each time.  ACTIVITY Walk with your walker as instructed. Use walker as long as suggested by your caregivers. Avoid periods of inactivity such as sitting longer than an hour when not asleep. This helps prevent blood clots.  You may resume a sexual relationship in one month or when given the OK by your doctor.  You may return to work once you are cleared by your doctor.  Do not drive a car for 6 weeks or until released by you surgeon.  Do not drive while taking narcotics.  WEIGHT BEARING Weight bearing as tolerated with assist device (walker, cane, etc) as directed, use it as long as suggested by your surgeon or therapist, typically at least 4-6 weeks.  POSTOPERATIVE CONSTIPATION PROTOCOL Constipation - defined medically as fewer than three stools per week and severe constipation as less than one stool per week.  One of the most common issues patients have following surgery is constipation.  Even if you have a regular bowel pattern at home, your normal regimen is likely to be disrupted due to multiple reasons following surgery.  Combination of anesthesia, postoperative narcotics, change in appetite and fluid intake all can affect your bowels.  In order to avoid complications following surgery, here are some recommendations in order to help you during  your recovery period.  Colace (docusate) - Pick up an over-the-counter form of Colace or another stool softener and take twice a day as long as you are requiring postoperative pain medications.  Take with a full glass of water daily.  If you experience loose stools or diarrhea, hold the colace until you stool forms back up.  If your symptoms do not get better within 1 week or if they get worse, check with your doctor.  Dulcolax (bisacodyl) - Pick up over-the-counter and  take as directed by the product packaging as needed to assist with the movement of your bowels.  Take with a full glass of water.  Use this product as needed if not relieved by Colace only.   MiraLax (polyethylene glycol) - Pick up over-the-counter to have on hand.  MiraLax is a solution that will increase the amount of water in your bowels to assist with bowel movements.  Take as directed and can mix with a glass of water, juice, soda, coffee, or tea.  Take if you go more than two days without a movement. Do not use MiraLax more than once per day. Call your doctor if you are still constipated or irregular after using this medication for 7 days in a row.  If you continue to have problems with postoperative constipation, please contact the office for further assistance and recommendations.  If you experience "the worst abdominal pain ever" or develop nausea or vomiting, please contact the office immediatly for further recommendations for treatment.  ITCHING  If you experience itching with your medications, try taking only a single pain pill, or even half a pain pill at a time.  You can also use Benadryl over the counter for itching or also to help with sleep.   TED HOSE STOCKINGS Wear the elastic stockings on both legs for three weeks following surgery during the day but you may remove then at night for sleeping.  MEDICATIONS See your medication summary on the After Visit Summary that the nursing staff will review with you prior to discharge.  You may have some home medications which will be placed on hold until you complete the course of blood thinner medication.  It is important for you to complete the blood thinner medication as prescribed by your surgeon.  Continue your approved medications as instructed at time of discharge.  PRECAUTIONS If you experience chest pain or shortness of breath - call 911 immediately for transfer to the hospital emergency department.  If you develop a fever greater  that 101 F, purulent drainage from wound, increased redness or drainage from wound, foul odor from the wound/dressing, or calf pain - CONTACT YOUR SURGEON.                                                   FOLLOW-UP APPOINTMENTS Make sure you keep all of your appointments after your operation with your surgeon and caregivers. You should call the office at the above phone number and make an appointment for approximately two weeks after the date of your surgery or on the date instructed by your surgeon outlined in the "After Visit Summary".   Pick up stool softner and laxative for home use following surgery while on pain medications. Do not submerge incision under water. Please use good hand washing techniques while changing dressing each day. May shower starting  three days after surgery. Please use a clean towel to pat the incision dry following showers. Continue to use ice for pain and swelling after surgery. Do not use any lotions or creams on the incision until instructed by your surgeon.  Take an 81 mg Aspirin daily at home.

## 2015-04-25 ENCOUNTER — Encounter: Payer: Self-pay | Admitting: Physician Assistant

## 2015-05-03 ENCOUNTER — Ambulatory Visit (INDEPENDENT_AMBULATORY_CARE_PROVIDER_SITE_OTHER): Payer: Medicare Other | Admitting: Physician Assistant

## 2015-05-03 ENCOUNTER — Encounter: Payer: Self-pay | Admitting: Physician Assistant

## 2015-05-03 VITALS — BP 152/80 | HR 60 | Temp 98.1°F | Resp 16 | Ht 65.5 in | Wt 127.0 lb

## 2015-05-03 DIAGNOSIS — Z789 Other specified health status: Secondary | ICD-10-CM

## 2015-05-03 DIAGNOSIS — N3281 Overactive bladder: Secondary | ICD-10-CM | POA: Insufficient documentation

## 2015-05-03 DIAGNOSIS — N289 Disorder of kidney and ureter, unspecified: Secondary | ICD-10-CM

## 2015-05-03 DIAGNOSIS — Z79899 Other long term (current) drug therapy: Secondary | ICD-10-CM

## 2015-05-03 DIAGNOSIS — Z1331 Encounter for screening for depression: Secondary | ICD-10-CM

## 2015-05-03 DIAGNOSIS — Z0001 Encounter for general adult medical examination with abnormal findings: Secondary | ICD-10-CM

## 2015-05-03 DIAGNOSIS — Z0000 Encounter for general adult medical examination without abnormal findings: Secondary | ICD-10-CM

## 2015-05-03 DIAGNOSIS — K2100 Gastro-esophageal reflux disease with esophagitis, without bleeding: Secondary | ICD-10-CM

## 2015-05-03 DIAGNOSIS — E785 Hyperlipidemia, unspecified: Secondary | ICD-10-CM

## 2015-05-03 DIAGNOSIS — I10 Essential (primary) hypertension: Secondary | ICD-10-CM

## 2015-05-03 DIAGNOSIS — M1990 Unspecified osteoarthritis, unspecified site: Secondary | ICD-10-CM

## 2015-05-03 DIAGNOSIS — M7989 Other specified soft tissue disorders: Secondary | ICD-10-CM

## 2015-05-03 DIAGNOSIS — E559 Vitamin D deficiency, unspecified: Secondary | ICD-10-CM

## 2015-05-03 DIAGNOSIS — M7061 Trochanteric bursitis, right hip: Secondary | ICD-10-CM

## 2015-05-03 DIAGNOSIS — E039 Hypothyroidism, unspecified: Secondary | ICD-10-CM

## 2015-05-03 DIAGNOSIS — R300 Dysuria: Secondary | ICD-10-CM

## 2015-05-03 DIAGNOSIS — R6889 Other general symptoms and signs: Secondary | ICD-10-CM

## 2015-05-03 DIAGNOSIS — H409 Unspecified glaucoma: Secondary | ICD-10-CM

## 2015-05-03 DIAGNOSIS — J45909 Unspecified asthma, uncomplicated: Secondary | ICD-10-CM

## 2015-05-03 DIAGNOSIS — R7309 Other abnormal glucose: Secondary | ICD-10-CM

## 2015-05-03 LAB — CBC WITH DIFFERENTIAL/PLATELET
BASOS PCT: 0 % (ref 0–1)
Basophils Absolute: 0 10*3/uL (ref 0.0–0.1)
Eosinophils Absolute: 0.1 10*3/uL (ref 0.0–0.7)
Eosinophils Relative: 3 % (ref 0–5)
HCT: 35.9 % — ABNORMAL LOW (ref 36.0–46.0)
HEMOGLOBIN: 11.5 g/dL — AB (ref 12.0–15.0)
LYMPHS PCT: 28 % (ref 12–46)
Lymphs Abs: 1.3 10*3/uL (ref 0.7–4.0)
MCH: 29.6 pg (ref 26.0–34.0)
MCHC: 32 g/dL (ref 30.0–36.0)
MCV: 92.3 fL (ref 78.0–100.0)
MONO ABS: 0.6 10*3/uL (ref 0.1–1.0)
MPV: 9.5 fL (ref 8.6–12.4)
Monocytes Relative: 13 % — ABNORMAL HIGH (ref 3–12)
Neutro Abs: 2.6 10*3/uL (ref 1.7–7.7)
Neutrophils Relative %: 56 % (ref 43–77)
Platelets: 328 10*3/uL (ref 150–400)
RBC: 3.89 MIL/uL (ref 3.87–5.11)
RDW: 13.8 % (ref 11.5–15.5)
WBC: 4.7 10*3/uL (ref 4.0–10.5)

## 2015-05-03 NOTE — Patient Instructions (Signed)
HOME CARE INSTRUCTIONS   Do not stand or sit in one position for long periods of time. Do not sit with your legs crossed. Rest with your legs raised during the day.  Your legs have to be higher than your heart so that gravity will force the valves to open, so please really elevate your legs.   Wear elastic stockings or support hose. Do not wear other tight, encircling garments around the legs, pelvis, or waist.  [employer]  has a wide variety of well priced compression stockings. [address], [place] [address] [phone]  Walk as much as possible to increase blood flow.  Raise the foot of your bed at night with 2-inch blocks. SEEK MEDICAL CARE IF:   The skin around your ankle starts to break down.  You have pain, redness, tenderness, or hard swelling developing in your leg over a vein.  You are uncomfortable due to leg pain. Document Released: 09/03/2005 Document Revised: 02/16/2012 Document Reviewed: 01/20/2011 Exit[employer] Patient Information 2014 [employer].  Preventive Care for Adults A healthy lifestyle and preventive care can promote health and wellness. Preventive health guidelines for women include the following key practices.  A routine yearly physical is a good way to check with your health care provider about your health and preventive screening. It is a chance to share any concerns and updates on your health and to receive a thorough exam.  Visit your dentist for a routine exam and preventive care every 6 months. Brush your teeth twice a day and floss once a day. Good oral hygiene prevents tooth decay and gum disease.  The frequency of eye exams is based on your age, health, family medical history, use of contact lenses, and other factors. Follow your health care provider's recommendations for frequency of eye exams.  Eat a healthy diet. Foods like vegetables, fruits, whole grains, low-fat dairy products, and lean protein foods contain the nutrients  you need without too many calories. Decrease your intake of foods high in solid fats, added sugars, and salt. Eat the right amount of calories for you.Get information about a proper diet from your health care provider, if necessary.  Regular physical exercise is one of the most important things you can do for your health. Most adults should get at least 150 minutes of moderate-intensity exercise (any activity that increases your heart rate and causes you to sweat) each week. In addition, most adults need muscle-strengthening exercises on 2 or more days a week.  Maintain a healthy weight. The body mass index (BMI) is a screening tool to identify possible weight problems. It provides an estimate of body fat based on height and weight. Your health care provider can find your BMI and can help you achieve or maintain a healthy weight.For adults 20 years and older:  A BMI below 18.5 is considered underweight.  A BMI of 18.5 to 24.9 is normal.  A BMI of 25 to 29.9 is considered overweight.  A BMI of 30 and above is considered obese.  Maintain normal blood lipids and cholesterol levels by exercising and minimizing your intake of saturated fat. Eat a balanced diet with plenty of fruit and vegetables. If your lipid or cholesterol levels are high, you are over 50, or you are at high risk for heart disease, you may need your cholesterol levels checked more frequently.Ongoing high lipid and cholesterol levels should be treated with medicines if diet and exercise are not working.  If you smoke, find out   from your health care provider how to quit. If you do not use tobacco, do not start.  Lung cancer screening is recommended for adults aged 55-80 years who are at high risk for developing lung cancer because of a history of smoking. A yearly low-dose CT scan of the lungs is recommended for people who have at least a 30-pack-year history of smoking and are a current smoker or have quit within the past 15 years.  A pack year of smoking is smoking an average of 1 pack of cigarettes a day for 1 year (for example: 1 pack a day for 30 years or 2 packs a day for 15 years). Yearly screening should continue until the smoker has stopped smoking for at least 15 years. Yearly screening should be stopped for people who develop a health problem that would prevent them from having lung cancer treatment.  Avoid use of street drugs. Do not share needles with anyone. Ask for help if you need support or instructions about stopping the use of drugs.  High blood pressure causes heart disease and increases the risk of stroke.  Ongoing high blood pressure should be treated with medicines if weight loss and exercise do not work.  If you are 55-79 years old, ask your health care provider if you should take aspirin to prevent strokes.  Diabetes screening involves taking a blood sample to check your fasting blood sugar level. This should be done once every 3 years, after age 45, if you are within normal weight and without risk factors for diabetes. Testing should be considered at a younger age or be carried out more frequently if you are overweight and have at least 1 risk factor for diabetes.  Breast cancer screening is essential preventive care for women. You should practice "breast self-awareness." This means understanding the normal appearance and feel of your breasts and may include breast self-examination. Any changes detected, no matter how small, should be reported to a health care provider. Women in their 20s and 30s should have a clinical breast exam (CBE) by a health care provider as part of a regular health exam every 1 to 3 years. After age 40, women should have a CBE every year. Starting at age 40, women should consider having a mammogram (breast X-ray test) every year. Women who have a family history of breast cancer should talk to their health care provider about genetic screening. Women at a high risk of breast cancer  should talk to their health care providers about having an MRI and a mammogram every year.  Breast cancer gene (BRCA)-related cancer risk assessment is recommended for women who have family members with BRCA-related cancers. BRCA-related cancers include breast, ovarian, tubal, and peritoneal cancers. Having family members with these cancers may be associated with an increased risk for harmful changes (mutations) in the breast cancer genes BRCA1 and BRCA2. Results of the assessment will determine the need for genetic counseling and BRCA1 and BRCA2 testing.  Routine pelvic exams to screen for cancer are no longer recommended for nonpregnant women who are considered low risk for cancer of the pelvic organs (ovaries, uterus, and vagina) and who do not have symptoms. Ask your health care provider if a screening pelvic exam is right for you.  If you have had past treatment for cervical cancer or a condition that could lead to cancer, you need Pap tests and screening for cancer for at least 20 years after your treatment. If Pap tests have been discontinued, your risk factors (  such as having a new sexual partner) need to be reassessed to determine if screening should be resumed. Some women have medical problems that increase the chance of getting cervical cancer. In these cases, your health care provider may recommend more frequent screening and Pap tests.    Colorectal cancer can be detected and often prevented. Most routine colorectal cancer screening begins at the age of 50 years and continues through age 75 years. However, your health care provider may recommend screening at an earlier age if you have risk factors for colon cancer. On a yearly basis, your health care provider may provide home test kits to check for hidden blood in the stool. Use of a small camera at the end of a tube, to directly examine the colon (sigmoidoscopy or colonoscopy), can detect the earliest forms of colorectal cancer. Talk to your  health care provider about this at age 50, when routine screening begins. Direct exam of the colon should be repeated every 5-10 years through age 75 years, unless early forms of pre-cancerous polyps or small growths are found.  Osteoporosis is a disease in which the bones lose minerals and strength with aging. This can result in serious bone fractures or breaks. The risk of osteoporosis can be identified using a bone density scan. Women ages 65 years and over and women at risk for fractures or osteoporosis should discuss screening with their health care providers. Ask your health care provider whether you should take a calcium supplement or vitamin D to reduce the rate of osteoporosis.  Menopause can be associated with physical symptoms and risks. Hormone replacement therapy is available to decrease symptoms and risks. You should talk to your health care provider about whether hormone replacement therapy is right for you.  Use sunscreen. Apply sunscreen liberally and repeatedly throughout the day. You should seek shade when your shadow is shorter than you. Protect yourself by wearing long sleeves, pants, a wide-brimmed hat, and sunglasses year round, whenever you are outdoors.  Once a month, do a whole body skin exam, using a mirror to look at the skin on your back. Tell your health care provider of new moles, moles that have irregular borders, moles that are larger than a pencil eraser, or moles that have changed in shape or color.  Stay current with required vaccines (immunizations).  Influenza vaccine. All adults should be immunized every year.  Tetanus, diphtheria, and acellular pertussis (Td, Tdap) vaccine. Pregnant women should receive 1 dose of Tdap vaccine during each pregnancy. The dose should be obtained regardless of the length of time since the last dose. Immunization is preferred during the 27th-36th week of gestation. An adult who has not previously received Tdap or who does not know  her vaccine status should receive 1 dose of Tdap. This initial dose should be followed by tetanus and diphtheria toxoids (Td) booster doses every 10 years. Adults with an unknown or incomplete history of completing a 3-dose immunization series with Td-containing vaccines should begin or complete a primary immunization series including a Tdap dose. Adults should receive a Td booster every 10 years.    Zoster vaccine. One dose is recommended for adults aged 60 years or older unless certain conditions are present.    Pneumococcal 13-valent conjugate (PCV13) vaccine. When indicated, a person who is uncertain of her immunization history and has no record of immunization should receive the PCV13 vaccine. An adult aged 19 years or older who has certain medical conditions and has not been previously immunized   should receive 1 dose of PCV13 vaccine. This PCV13 should be followed with a dose of pneumococcal polysaccharide (PPSV23) vaccine. The PPSV23 vaccine dose should be obtained at least 8 weeks after the dose of PCV13 vaccine. An adult aged 19 years or older who has certain medical conditions and previously received 1 or more doses of PPSV23 vaccine should receive 1 dose of PCV13. The PCV13 vaccine dose should be obtained 1 or more years after the last PPSV23 vaccine dose.    Pneumococcal polysaccharide (PPSV23) vaccine. When PCV13 is also indicated, PCV13 should be obtained first. All adults aged 65 years and older should be immunized. An adult younger than age 65 years who has certain medical conditions should be immunized. Any person who resides in a nursing home or long-term care facility should be immunized. An adult smoker should be immunized. People with an immunocompromised condition and certain other conditions should receive both PCV13 and PPSV23 vaccines. People with human immunodeficiency virus (HIV) infection should be immunized as soon as possible after diagnosis. Immunization during chemotherapy  or radiation therapy should be avoided. Routine use of PPSV23 vaccine is not recommended for American Indians, [place] Natives, or people younger than 65 years unless there are medical conditions that require PPSV23 vaccine. When indicated, people who have unknown immunization and have no record of immunization should receive PPSV23 vaccine. One-time revaccination 5 years after the first dose of PPSV23 is recommended for people aged 19-64 years who have chronic kidney failure, nephrotic syndrome, asplenia, or immunocompromised conditions. People who received 1-2 doses of PPSV23 before age 65 years should receive another dose of PPSV23 vaccine at age 65 years or later if at least 5 years have passed since the previous dose. Doses of PPSV23 are not needed for people immunized with PPSV23 at or after age 65 years.   Preventive Services / Frequency  Ages 65 years and over  Blood pressure check.  Lipid and cholesterol check.  Lung cancer screening. / Every year if you are aged 55-80 years and have a 30-pack-year history of smoking and currently smoke or have quit within the past 15 years. Yearly screening is stopped once you have quit smoking for at least 15 years or develop a health problem that would prevent you from having lung cancer treatment.  Clinical breast exam.** / Every year after age 40 years.  BRCA-related cancer risk assessment.** / For women who have family members with a BRCA-related cancer (breast, ovarian, tubal, or peritoneal cancers).  Mammogram.** / Every year beginning at age 40 years and continuing for as long as you are in good health. Consult with your health care provider.  Pap test.** / Every 3 years starting at age 30 years through age 65 or 70 years with 3 consecutive normal Pap tests. Testing can be stopped between 65 and 70 years with 3 consecutive normal Pap tests and no abnormal Pap or HPV tests in the past 10 years.  Fecal occult blood test (FOBT) of stool. / Every  year beginning at age 50 years and continuing until age 75 years. You may not need to do this test if you get a colonoscopy every 10 years.  Flexible sigmoidoscopy or colonoscopy.** / Every 5 years for a flexible sigmoidoscopy or every 10 years for a colonoscopy beginning at age 50 years and continuing until age 75 years.  Hepatitis C blood test.** / For all people born from 1945 through 1965 and any individual with known risks for hepatitis C.  Osteoporosis screening.** /   A one-time screening for women ages 65 years and over and women at risk for fractures or osteoporosis.  Skin self-exam. / Monthly.  Influenza vaccine. / Every year.  Tetanus, diphtheria, and acellular pertussis (Tdap/Td) vaccine.** / 1 dose of Td every 10 years.  Zoster vaccine.** / 1 dose for adults aged 60 years or older.  Pneumococcal 13-valent conjugate (PCV13) vaccine.** / Consult your health care provider.  Pneumococcal polysaccharide (PPSV23) vaccine.** / 1 dose for all adults aged 65 years and older. Screening for abdominal aortic aneurysm (AAA)  by ultrasound is recommended for people who have history of high blood pressure or who are current or former smokers.  

## 2015-05-03 NOTE — Progress Notes (Addendum)
Medicare visit and CPE Assessment:   1. Hypertension - CBC with Differential - BASIC METABOLIC PANEL WITH GFR - Hepatic function panel - Microalbumin / creatinine urine ratio  2. Unspecified hypothyroidism - TSH  3. Renal insufficiency Check BMP  4. Vitamin D deficiency - Vit D  25 hydroxy (rtn osteoporosis monitoring)  5. Hyperlipidemia - Lipid panel  6. Acute cystitis versus OAB - Urinalysis, Routine w reflex microscopic - Urine culture -Myrebetiq samples given  7.  Encounter for long-term (current) use of other medications - Magnesium  8. Comfort measures At this time the patient and her daughter do not want any invasive test or test, they refuse EKG, etc and would only like to come back once a year. II agree with this. She is on minimal medications, has not had any falls and has a good support system. We will see her yearly and she will follow up as needed.   9. Right left swelling No warmth/redness in her right leg but obvious 2-3x larger than left and with recent surgery will get US to rule out DVT, however likely from venous insuff, suggest elevating and continue ASA.   10. GERD Continue medications  Addendum: Talked with the patient, she has some discomfort in her leg and continuing swelling, no warmth, redness, CP, SOB. Due to kidney function will send in Coumadin 5mg  to start 1 pill daily, follow up Tuesday in the office and we will try to get US scheduled. Patient informed if any pain in her leg, warmth/redness, SOB or CP go to the ER.   Future Appointments Date Time Provider Department Center  05/02/2016 9:30 AM Quentin MullingAmanda Collier, PA-C [id number] None      Plan:   During the course of the visit the patient was educated and counseled about appropriate screening and preventive services including:    Pneumococcal vaccine   Influenza vaccine  Td vaccine  Screening electrocardiogram  Screening mammography  Bone densitometry screening  Colorectal  cancer screening  Diabetes screening  Glaucoma screening  Nutrition counseling   Conditions/risks identified: BMI: Discussed weight loss, diet, and increase physical activity.  Increase physical activity: AHA recommends 150 minutes of physical activity a week.  Medications reviewed DEXA- declined Diabetes at goal, ACE/ARB therapy No, Reason not on Ace Inhibitor/ARB therapy:  not needed Urinary Incontinence is an issue: discussed non pharmacology and pharmacology options.  Fall risk: moderate- discussed PT, home fall assessment, medications.   Subjective:   Kelly Li is a 81 y.o. female who presents for Medicare Annual Wellness Visit and complete physical.    Date of last medicare wellness visit was 04/24/2014  Her blood pressure has been controlled at home, today their BP is BP: (!) 152/80 mmHg She does not workout, but does walk to and from her mailbox, walks in grocery store, walks with granddaugter and great grandson, active at home. She denies chest pain, shortness of breath, dizziness. Uses proventil occ but otherwise normal.  She has been having bilateral edema x 1 week, right greater than left, denies warmth/redness.  She is not on cholesterol medication and denies myalgias. Her cholesterol is at goal. The cholesterol last visit was:   Lab Results  Component Value Date   CHOL 149 02/01/2015   HDL 53 02/01/2015   LDLCALC 82 02/01/2015   TRIG 69 02/01/2015   CHOLHDL 2.8 02/01/2015   Last Z6XA1C in the office was:  Lab Results  Component Value Date   HGBA1C 5.3 10/26/2014   Patient is on  Vitamin D supplement.   She has lower back pain and hip pain occ, had right hip bursectomy with Dr. Berton Lan 04/23/2015.  She has been walking with a cane but she stepped off a curb and twisted her lower back, now walking with a walker but did not fall. She has just been taking tylenol since the surgery and states that her hip feels much better.  She has epigastric tenderness that occ goes  to her back, and has some GERD symptoms She has urinary incontinence and it is unknown if she is on vesicare, has nocturia x 2-3 and she has some dysuria denies diarrhea, contipation, blood in stool, dark stool.  Daughter is here with her and provides some of the story.   She is on thyroid medication. Her medication was not changed last visit.   Lab Results  Component Value Date   TSH 1.350 02/01/2015  .     Medication Review Current Outpatient Prescriptions on File Prior to Visit  Medication Sig Dispense Refill  . acetaminophen (TYLENOL) 650 MG CR tablet Take 650 mg by mouth every 8 (eight) hours as needed for pain.     Marland Kitchen albuterol (PROVENTIL HFA;VENTOLIN HFA) 108 (90 BASE) MCG/ACT inhaler Inhale 2 puffs into the lungs every 6 (six) hours as needed. Shortness of breath 1 Inhaler 3  . aspirin EC 81 MG tablet Take 81 mg by mouth every morning.     . bimatoprost (LUMIGAN) 0.01 % SOLN Place 1 drop into both eyes at bedtime.    . cholecalciferol (VITAMIN D) 1000 UNITS tablet Take 2,000 Units by mouth every morning.     . fluticasone (FLONASE) 50 MCG/ACT nasal spray Place 2 sprays into both nostrils daily as needed for allergies or rhinitis.     Marland Kitchen latanoprost (XALATAN) 0.005 % ophthalmic solution Place 1 drop into the left eye at bedtime.  22  . levothyroxine (SYNTHROID, LEVOTHROID) 100 MCG tablet TAKE 1 TABLET BY MOUTH EVERY DAY 90 tablet 3  . methocarbamol (ROBAXIN) 500 MG tablet Take 1 tablet (500 mg total) by mouth every 6 (six) hours as needed for muscle spasms. 80 tablet 0  . pantoprazole (PROTONIX) 40 MG tablet TAKE 1 TABLET EVERY DAY FOR ACID REFLUX (Patient taking differently: TAKE 1 TABLET EVERY DAY FOR ACID REFLUX(pt no longer taking)) 90 tablet 1  . ranitidine (ZANTAC) 300 MG tablet Take twice a day while trying to get off PPI, then can go to once at night (Patient taking differently: Take 300 mg by mouth 2 (two) times daily. ) 60 tablet 3  . timolol (TIMOPTIC-XR) 0.5 % ophthalmic  gel-forming Place 1 drop into the left eye every morning.  3  . traMADol (ULTRAM) 50 MG tablet Take 1-2 tablets (50-100 mg total) by mouth every 6 (six) hours as needed for moderate pain. 80 tablet 1  . VESICARE 5 MG tablet TAKE 1 TABLET BY MOUTH EVERY DAY FOR OVER ACTIVE BLADDER 30 tablet 3   No current facility-administered medications on file prior to visit.    Current Problems (verified) Patient Active Problem List   Diagnosis Date Noted  . Hip bursitis 04/24/2015  . Trochanteric bursitis of right hip 04/23/2015  . Abnormal glucose 10/26/2014  . Medication management 10/26/2014  . Asthma   . Hypothyroidism   . Hypertension   . GERD (gastroesophageal reflux disease)   . Vitamin D deficiency   . Hyperlipidemia   . DJD (degenerative joint disease)   . Renal insufficiency   . Abdominal wall  pain in right lower quadrant 10/14/2013    Screening Tests  Immunization History  Administered Date(s) Administered  . Influenza, High Dose Seasonal PF 08/28/2014  . Influenza-Unspecified 08/16/2013  . Pneumococcal Conjugate-13 08/28/2014  . Pneumococcal Polysaccharide-23 07/08/2001  . Tdap 04/07/2013    Preventative care: Last colonoscopy: 2003 declines another Last mammogram: declines Last pap smear/pelvic exam: declines  DEXA: 2009 declines  Prior vaccinations: TD or Tdap: 2014  Influenza: 2014 Pneumococcal: 2002 and per daughter she does not want Prevnar 13.  Shingles/Zostavax: declines  Names of Other Physician/Practitioners you currently use: 1. [facility]- here for primary care 2. Dr. Nelle Don, eye doctor, last visit April q 6 months bifocals/glacoma.  3. Dr. Suzzanne Cloud, dentist, last visit q 6 months Patient Care Team: Dr. Bryn Gulling, MD as PCP - General (Internal Medicine)  Allergies Allergies  Allergen Reactions  . Codeine Anaphylaxis    unknown  . Morphine And Related Nausea And Vomiting  . Nsaids     Surgical history Past Surgical History  Procedure Laterality Date  . Hip surgery    . Thyroid surgery    . Esophagogastroduodenoscopy  2009  . Nephrectomy  2003  . Colonoscopy  2003  . Breast biopsy Right 1997  . Cataract extraction, bilateral Bilateral   . Excision/release bursa hip Right 04/23/2015    Procedure: RIGHT HIP BURSECTOMY, EXCISION OF HETEROTOPIC BONE ;  Surgeon: Ollen Gross, MD;  Location: [facility];  Service: Orthopedics;  Laterality: Right;   Family history Family History  Problem Relation Age of Onset  . Cancer Mother     breast  . Cancer Father     prostate   Risk Factors: Osteoporosis: postmenopausal estrogen deficiency and dietary calcium and/or vitamin D deficiency History of fracture in the past year: No  Tobacco History  Substance Use Topics  . Smoking status: Never Smoker   . Smokeless tobacco: Not on file  . Alcohol Use: No   She does not smoke.  Patient is a former smoker. Are there smokers in your home (other than you)?  No  Alcohol Current alcohol use: none  Caffeine Current caffeine use: denies use  Exercise Current exercise habits: The patient does not participate in regular exercise at present.  Current exercise: housecleaning  Nutrition/Diet Current diet: in general, a "healthy" diet    Cardiac risk factors: advanced age (older than 44 for men, 25 for women), dyslipidemia, hypertension and sedentary lifestyle.  Depression Screen  Q1: Over the past two weeks, have you felt down, depressed or hopeless? No  Q2: Over the past two weeks, have you felt little interest or pleasure in doing things? No  Have you lost interest or pleasure in daily life? No  Do you often feel hopeless? No  Do you cry easily over simple problems? No  Activities of Daily Living In your present state of health, do you have any difficulty performing the following activities?:  Driving? Yes Managing money?  Yes Feeding yourself? No Getting from bed to  chair? No Climbing a flight of stairs? Yes Preparing food and eating?: No Bathing or showering? No Getting dressed: No Getting to the toilet? No Using the toilet:No Moving around from place to place: No In the past year have you fallen or had a near fall?:No   Are you sexually active?  No  Do you have more than one partner?  No  Vision Difficulties: No  Hearing Difficulties: Yes Do you often ask people to speak  up or repeat themselves? Yes Do you experience ringing or noises in your ears? No Do you have difficulty understanding soft or whispered voices? No  Cognition  Do you feel that you have a problem with memory?No  Do you often misplace items? No  Do you feel safe at home?  Yes  Advanced directives Does patient have a Health Care Power of Attorney? Yes Does patient have a Living Will? Yes   Objective:     Blood pressure 152/80, pulse 60, temperature 98.1 F (36.7 C), resp. rate 16, height 5' 5.5" (1.664 m), weight 127 lb (57.607 kg). Body mass index is 20.81 kg/(m^2).  General appearance: alert, no distress, WD/WN,  female Cognitive Testing  Alert? Yes  Normal Appearance?Yes  Oriented to person? Yes  Place? Yes   Time? Yes  Recall of three objects?  Yes  Can perform simple calculations? Yes  Displays appropriate judgment?Yes  Can read the correct time from a watch face?Yes  HEENT: normocephalic, sclerae anicteric, TMs pearly, nares patent, no discharge or erythema, pharynx normal Oral cavity: MMM, no lesions Neck: supple, no lymphadenopathy, no thyromegaly, no masses Heart: RRR, normal S1, S2, no murmurs Lungs: CTA bilaterally, no wheezes, rhonchi, or rales Abdomen: +bs, soft, epigastric tenderness without rebound, non distended, no masses, no hepatomegaly, no splenomegaly Musculoskeletal: nontender, no swelling, no obvious deformity Extremities: 2-3+ edema right leg and 1+ edema left leg, no cyanosis, no clubbing Pulses: 2+ symmetric, upper and lower  extremities, normal cap refill Neurological: alert, oriented x 3, CN2-12 intact, strength normal upper extremities and lower extremities, sensation normal throughout, DTRs 2+ throughout, no cerebellar signs, gait slow with walker Psychiatric: normal affect, behavior normal, pleasant  Breast: defer Gyn: defer   Rectal: defer  Medicare Attestation I have personally reviewed: The patient's medical and social history Their use of alcohol, tobacco or illicit drugs Their current medications and supplements The patient's functional ability including ADLs,fall risks, home safety risks, cognitive, and hearing and visual impairment Diet and physical activities Evidence for depression or mood disorders  The patient's weight, height, BMI, and visual acuity have been recorded in the chart.  I have made referrals, counseling, and provided education to the patient based on review of the above and I have provided the patient with a written personalized care plan for preventive services.     Quentin Mulling, PA-C   05/03/2015

## 2015-05-04 LAB — BASIC METABOLIC PANEL WITH GFR
BUN: 26 mg/dL — ABNORMAL HIGH (ref 6–23)
CALCIUM: 9.4 mg/dL (ref 8.4–10.5)
CO2: 26 mEq/L (ref 19–32)
CREATININE: 1.25 mg/dL — AB (ref 0.50–1.10)
Chloride: 103 mEq/L (ref 96–112)
GFR, EST AFRICAN AMERICAN: 42 mL/min — AB
GFR, Est Non African American: 37 mL/min — ABNORMAL LOW
Glucose, Bld: 100 mg/dL — ABNORMAL HIGH (ref 70–99)
POTASSIUM: 4.5 meq/L (ref 3.5–5.3)
SODIUM: 140 meq/L (ref 135–145)

## 2015-05-04 LAB — VITAMIN D 25 HYDROXY (VIT D DEFICIENCY, FRACTURES): VIT D 25 HYDROXY: 51 ng/mL (ref 30–100)

## 2015-05-04 LAB — HEPATIC FUNCTION PANEL
ALT: 9 U/L (ref 0–35)
AST: 13 U/L (ref 0–37)
Albumin: 3.8 g/dL (ref 3.5–5.2)
Alkaline Phosphatase: 91 U/L (ref 39–117)
Bilirubin, Direct: 0.2 mg/dL (ref 0.0–0.3)
Indirect Bilirubin: 0.6 mg/dL (ref 0.2–1.2)
Total Bilirubin: 0.8 mg/dL (ref 0.2–1.2)
Total Protein: 6.8 g/dL (ref 6.0–8.3)

## 2015-05-04 LAB — HEMOGLOBIN A1C
HEMOGLOBIN A1C: 5.6 % (ref <5.7)
Mean Plasma Glucose: 114 mg/dL (ref <117)

## 2015-05-04 LAB — LIPID PANEL
CHOL/HDL RATIO: 3.3 ratio
Cholesterol: 164 mg/dL (ref 0–200)
HDL: 49 mg/dL (ref >=46)
LDL Cholesterol: 100 mg/dL — ABNORMAL HIGH (ref 0–99)
Triglycerides: 76 mg/dL (ref <150)
VLDL: 15 mg/dL (ref 0–40)

## 2015-05-04 LAB — TSH: TSH: 2.469 u[IU]/mL (ref 0.350–4.500)

## 2015-05-04 LAB — MAGNESIUM: Magnesium: 2.1 mg/dL (ref 1.5–2.5)

## 2015-05-04 LAB — D-DIMER, QUANTITATIVE (NOT AT ARMC): D DIMER QUANT: 2.07 ug{FEU}/mL — AB (ref 0.00–0.48)

## 2015-05-04 MED ORDER — WARFARIN SODIUM 5 MG PO TABS
5.0000 mg | ORAL_TABLET | Freq: Every day | ORAL | Status: DC
Start: 2015-05-04 — End: 2015-08-17

## 2015-05-04 NOTE — Addendum Note (Signed)
Addended by: Quentin MullingOLLIER, Ardelle Haliburton R on: 05/04/2015 12:22 PM   Modules accepted: Orders, SmartSet

## 2015-05-08 ENCOUNTER — Ambulatory Visit
Admission: RE | Admit: 2015-05-08 | Discharge: 2015-05-08 | Disposition: A | Discharge status: Home / Self Care | Payer: Medicare Other | Source: Ambulatory Visit | Attending: Physician Assistant | Admitting: Physician Assistant

## 2015-05-08 DIAGNOSIS — M7989 Other specified soft tissue disorders: Secondary | ICD-10-CM

## 2015-05-09 ENCOUNTER — Other Ambulatory Visit: Payer: Medicare Other

## 2015-05-09 DIAGNOSIS — R300 Dysuria: Secondary | ICD-10-CM

## 2015-05-10 LAB — URINALYSIS, ROUTINE W REFLEX MICROSCOPIC
Bilirubin Urine: NEGATIVE
GLUCOSE, UA: NEGATIVE mg/dL
Hgb urine dipstick: NEGATIVE
KETONES UR: NEGATIVE mg/dL
Leukocytes, UA: NEGATIVE
Nitrite: NEGATIVE
Protein, ur: NEGATIVE mg/dL
Urobilinogen, UA: 0.2 mg/dL (ref 0.0–1.0)
pH: 6 (ref 5.0–8.0)

## 2015-05-11 LAB — URINE CULTURE

## 2015-06-23 ENCOUNTER — Other Ambulatory Visit: Payer: Self-pay | Admitting: Physician Assistant

## 2015-08-17 ENCOUNTER — Ambulatory Visit (INDEPENDENT_AMBULATORY_CARE_PROVIDER_SITE_OTHER): Payer: Medicare Other | Admitting: Internal Medicine

## 2015-08-17 ENCOUNTER — Encounter: Payer: Self-pay | Admitting: Internal Medicine

## 2015-08-17 VITALS — BP 124/60 | HR 68 | Temp 97.9°F | Resp 16 | Ht 65.5 in | Wt 123.4 lb

## 2015-08-17 DIAGNOSIS — I10 Essential (primary) hypertension: Secondary | ICD-10-CM

## 2015-08-17 DIAGNOSIS — R7309 Other abnormal glucose: Secondary | ICD-10-CM | POA: Diagnosis not present

## 2015-08-17 DIAGNOSIS — Z6820 Body mass index (BMI) 20.0-20.9, adult: Secondary | ICD-10-CM | POA: Diagnosis not present

## 2015-08-17 DIAGNOSIS — E039 Hypothyroidism, unspecified: Secondary | ICD-10-CM

## 2015-08-17 DIAGNOSIS — K219 Gastro-esophageal reflux disease without esophagitis: Secondary | ICD-10-CM

## 2015-08-17 DIAGNOSIS — Z79899 Other long term (current) drug therapy: Secondary | ICD-10-CM

## 2015-08-17 DIAGNOSIS — E785 Hyperlipidemia, unspecified: Secondary | ICD-10-CM

## 2015-08-17 DIAGNOSIS — E559 Vitamin D deficiency, unspecified: Secondary | ICD-10-CM

## 2015-08-17 DIAGNOSIS — Z0000 Encounter for general adult medical examination without abnormal findings: Secondary | ICD-10-CM | POA: Insufficient documentation

## 2015-08-17 LAB — HEPATIC FUNCTION PANEL
ALK PHOS: 53 U/L (ref 33–130)
ALT: 6 U/L (ref 6–29)
AST: 14 U/L (ref 10–35)
Albumin: 3.7 g/dL (ref 3.6–5.1)
BILIRUBIN INDIRECT: 0.6 mg/dL (ref 0.2–1.2)
BILIRUBIN TOTAL: 0.7 mg/dL (ref 0.2–1.2)
Bilirubin, Direct: 0.1 mg/dL (ref <=0.2)
TOTAL PROTEIN: 6.6 g/dL (ref 6.1–8.1)

## 2015-08-17 LAB — LIPID PANEL
Cholesterol: 147 mg/dL (ref 125–200)
HDL: 48 mg/dL (ref >=46)
LDL CALC: 75 mg/dL (ref <130)
TRIGLYCERIDES: 120 mg/dL (ref <150)
Total CHOL/HDL Ratio: 3.1 Ratio (ref <=5.0)
VLDL: 24 mg/dL (ref <30)

## 2015-08-17 LAB — CBC WITH DIFFERENTIAL/PLATELET
BASOS ABS: 0 10*3/uL (ref 0.0–0.1)
Basophils Relative: 1 % (ref 0–1)
Eosinophils Absolute: 0.1 10*3/uL (ref 0.0–0.7)
Eosinophils Relative: 3 % (ref 0–5)
HEMATOCRIT: 36.3 % (ref 36.0–46.0)
Hemoglobin: 12.1 g/dL (ref 12.0–15.0)
LYMPHS PCT: 33 % (ref 12–46)
Lymphs Abs: 1.6 10*3/uL (ref 0.7–4.0)
MCH: 30 pg (ref 26.0–34.0)
MCHC: 33.3 g/dL (ref 30.0–36.0)
MCV: 90.1 fL (ref 78.0–100.0)
MPV: 9.7 fL (ref 8.6–12.4)
Monocytes Absolute: 0.6 10*3/uL (ref 0.1–1.0)
Monocytes Relative: 12 % (ref 3–12)
NEUTROS PCT: 51 % (ref 43–77)
Neutro Abs: 2.4 10*3/uL (ref 1.7–7.7)
Platelets: 215 10*3/uL (ref 150–400)
RBC: 4.03 MIL/uL (ref 3.87–5.11)
RDW: 14.3 % (ref 11.5–15.5)
WBC: 4.8 10*3/uL (ref 4.0–10.5)

## 2015-08-17 LAB — BASIC METABOLIC PANEL WITH GFR
BUN: 32 mg/dL — AB (ref 7–25)
CHLORIDE: 105 mmol/L (ref 98–110)
CO2: 28 mmol/L (ref 20–31)
Calcium: 9 mg/dL (ref 8.6–10.4)
Creat: 1.4 mg/dL — ABNORMAL HIGH (ref 0.60–0.88)
GFR, EST NON AFRICAN AMERICAN: 32 mL/min — AB (ref >=60)
GFR, Est African American: 37 mL/min — ABNORMAL LOW (ref >=60)
GLUCOSE: 113 mg/dL — AB (ref 65–99)
POTASSIUM: 4.6 mmol/L (ref 3.5–5.3)
Sodium: 141 mmol/L (ref 135–146)

## 2015-08-17 LAB — HEMOGLOBIN A1C
Hgb A1c MFr Bld: 5.5 % (ref <5.7)
Mean Plasma Glucose: 111 mg/dL (ref <117)

## 2015-08-17 LAB — TSH: TSH: 2.751 u[IU]/mL (ref 0.350–4.500)

## 2015-08-17 LAB — MAGNESIUM: Magnesium: 2.1 mg/dL (ref 1.5–2.5)

## 2015-08-17 NOTE — Patient Instructions (Signed)

## 2015-08-17 NOTE — Progress Notes (Signed)
Patient ID: Kelly Li, female   DOB: July 31, 1917, 81 y.o.   MRN: [id number]   This very nice and spry  81 y.o. [facility] presents for 6 month follow up with labile Hypertension, diet controlled Hyperlipidemia, Pre-Diabetes Screening and Vitamin D Deficiency. Patient is about 3 months s/p right THR surg and doing well.      Patient is followed for HTN since 1997 and has been off & on treatment over the years & BP has recently been controlled and today's BP: 124/60 mmHg. Patient has had no complaints of any cardiac type chest pain, palpitations, dyspnea/orthopnea/PND, dizziness, claudication, or dependent edema.     Hyperlipidemia is controlled with diet. Patient denies myalgias or other med SE's. Last Lipids were at goal - Cholesterol 164; HDL 49; LDL 100*; Triglycerides 76 on 05/03/2015.     Also, the patient his monitored expectantly for PreDiabetes and has had no symptoms of reactive hypoglycemia, diabetic polys, paresthesias or visual blurring.  Last A1c was 5.6% on 05/03/2015.     Further, the patient also has history of Vitamin D Deficiency of 28 in 2008 and supplements vitamin D without any suspected side-effects. Last vitamin D was  51 on 05/03/2015.  Medication Sig  . acetaminophen (TYLENOL) 650 MG CR  Take 650 mg by mouth every 8 (eight) hours as needed for pain.   Marland Kitchen albuterol  HFA  inhaler Inhale 2 puffs into the lungs every 6 (six) hours as needed. Shortness of breath  . bimatoprost (LUMIGAN) 0.01 % SOLN Place 1 drop into both eyes at bedtime.  Marland Kitchen VITAMIN D 1000 UNITS tablet Take 2,000 Units by mouth every morning.   Marland Kitchen FLONASE nasal spray Place 2 sprays into both nostrils daily as needed for allergies or rhinitis.   Marland Kitchen latanoprost (XALATAN) 0.005 % ophth soln Place 1 drop into the left eye at bedtime.  Marland Kitchen levothyroxine  100 MCG tablet TAKE 1 TABLET BY MOUTH EVERY DAY  . pantoprazole (PROTONIX) 40 MG tablet TAKE 1 TABLET EVERY DAY FOR ACID REFLUX(pt no longer taking))  . ranitidine (ZANTAC) 300  MG tablet Take 300 mg by mouth 2 (two) times daily. )  . timolol (TIMOPTIC-XR) 0.5 % ophth  Place 1 drop into the left eye every morning.  . VESICARE 5 MG tablet TAKE 1 TABLET BY MOUTH EVERY DAY FOR OVER ACTIVE BLADDER  . traMADol (ULTRAM) 50 MG tablet Take 1-2 tablets (50-100 mg total) by mouth every 6 (six) hours as needed for moderate pain.   Allergies  Allergen Reactions  . Codeine Anaphylaxis    unknown  . Morphine And Related Nausea And Vomiting  . Nsaids    PMHx:   Past Medical History  Diagnosis Date  . Asthma   . Hypertension   . GERD (gastroesophageal reflux disease)   . Vitamin D deficiency   . Hyperlipidemia   . Renal insufficiency   . Glaucoma    Immunization History  Administered Date(s) Administered  . Influenza, High Dose Seasonal PF 08/28/2014  . Influenza-Unspecified 08/16/2013  . Pneumococcal Conjugate-13 08/28/2014  . Pneumococcal Polysaccharide-23 07/08/2001  . Tdap 04/07/2013   Past Surgical History  Procedure Laterality Date  . Hip surgery    . Thyroid surgery    . Esophagogastroduodenoscopy  2009  . Nephrectomy  2003  . Colonoscopy  2003  . Breast biopsy Right 1997  . Cataract extraction, bilateral Bilateral   . Excision/release bursa hip Right 04/23/2015    Procedure: RIGHT HIP BURSECTOMY, EXCISION OF HETEROTOPIC BONE ;  Surgeon: Ollen Gross, MD;  Location: [facility];  Service: Orthopedics;  Laterality: Right;   FHx:    Reviewed / unchanged  SHx:    Reviewed / unchanged  Systems Review:  Constitutional: Denies fever, chills, wt changes, headaches, insomnia, fatigue, night sweats, change in appetite. Eyes: Denies redness, blurred vision, diplopia, discharge, itchy, watery eyes.  ENT: Denies discharge, congestion, post nasal drip, epistaxis, sore throat, earache, hearing loss, dental pain, tinnitus, vertigo, sinus pain, snoring.  CV: Denies chest pain, palpitations, irregular heartbeat, syncope, dyspnea, diaphoresis, orthopnea, PND, claudication  or edema. Respiratory: denies cough, dyspnea, DOE, pleurisy, hoarseness, laryngitis, wheezing.  Gastrointestinal: Denies dysphagia, odynophagia, heartburn, reflux, water brash, abdominal pain or cramps, nausea, vomiting, bloating, diarrhea, constipation, hematemesis, melena, hematochezia  or hemorrhoids. Genitourinary: Denies dysuria, frequency, urgency, nocturia, hesitancy, discharge, hematuria or flank pain. Musculoskeletal: Denies arthralgias, myalgias, stiffness, jt. swelling, pain, limping or strain/sprain.  Skin: Denies pruritus, rash, hives, warts, acne, eczema or change in skin lesion(s). Neuro: No weakness, tremor, incoordination, spasms, paresthesia or pain. Psychiatric: Denies confusion, memory loss or sensory loss. Endo: Denies change in weight, skin or hair change.  Heme/Lymph: No excessive bleeding, bruising or enlarged lymph nodes.  Physical Exam  BP 124/60 mmHg  Pulse 68  Temp(Src) 97.9 F (36.6 C)  Resp 16  Ht 5' 5.5" (1.664 m)  Wt 123 lb 6.4 oz (55.974 kg)  BMI 20.22 kg/m2  Appears well nourished and in no distress. Eyes: PERRLA, EOMs, conjunctiva no swelling or erythema. Sinuses: No frontal/maxillary tenderness ENT/Mouth: EAC's clear, TM's nl w/o erythema, bulging. Nares clear w/o erythema, swelling, exudates. Oropharynx clear without erythema or exudates. Oral hygiene is good. Tongue normal, non obstructing. Hearing intact.  Neck: Supple. Thyroid nl. Car 2+/2+ without bruits, nodes or JVD. Chest: Respirations nl with BS clear & equal w/o rales, rhonchi, wheezing or stridor.  Cor: Heart sounds normal w/ regular rate and rhythm without sig. murmurs, gallops, clicks, or rubs. Peripheral pulses normal and equal  without edema.  Abdomen: Soft & bowel sounds normal. Non-tender w/o guarding, rebound, hernias, masses, or organomegaly.  Lymphatics: Unremarkable.  Musculoskeletal: Full ROM all peripheral extremities, joint stability, 5/5 strength, and normal gait.  Skin:  Warm, dry without exposed rashes, lesions or ecchymosis apparent.  Neuro: Cranial nerves intact, reflexes equal bilaterally. Sensory-motor testing grossly intact. Tendon reflexes grossly intact.  Pysch: Alert & oriented x 3.  Insight and judgement nl & appropriate. No ideations.  Assessment and Plan:  1. Essential hypertension  - TSH  2. Hyperlipidemia  - Lipid panel  3. Abnormal glucose  - Hemoglobin A1c - Insulin, random  4. Vitamin D deficiency  - Vit D  25 hydroxy   5. Hypothyroidism   6. Gastroesophageal reflux disease   7. Body mass index (BMI) of 20.22   8. Medication management  - CBC with Differential/Platelet - BASIC METABOLIC PANEL WITH GFR - Hepatic function panel - Magnesium   Recommended regular exercise, BP monitoring, weight control, and discussed med and SE's. Recommended labs to assess and monitor clinical status. Further disposition pending results of labs. Over 30 minutes of exam, counseling, chart review was performed

## 2015-08-18 ENCOUNTER — Encounter: Payer: Self-pay | Admitting: Internal Medicine

## 2015-08-18 LAB — INSULIN, RANDOM: Insulin: 16.5 u[IU]/mL (ref 2.0–19.6)

## 2015-08-18 LAB — VITAMIN D 25 HYDROXY (VIT D DEFICIENCY, FRACTURES): Vit D, 25-Hydroxy: 48 ng/mL (ref 30–100)

## 2015-09-12 ENCOUNTER — Ambulatory Visit (INDEPENDENT_AMBULATORY_CARE_PROVIDER_SITE_OTHER): Payer: Medicare Other | Admitting: Internal Medicine

## 2015-09-12 ENCOUNTER — Encounter: Payer: Self-pay | Admitting: Internal Medicine

## 2015-09-12 VITALS — BP 142/60 | HR 60 | Temp 98.2°F | Resp 16 | Ht 65.5 in | Wt 127.0 lb

## 2015-09-12 DIAGNOSIS — Z23 Encounter for immunization: Secondary | ICD-10-CM | POA: Diagnosis not present

## 2015-09-12 DIAGNOSIS — R300 Dysuria: Secondary | ICD-10-CM

## 2015-09-12 MED ORDER — CEPHALEXIN 500 MG PO CAPS: 500.0000 mg | ORAL_CAPSULE | Freq: Three times a day (TID) | ORAL | Status: AC

## 2015-09-12 NOTE — Progress Notes (Signed)
   Subjective:    Patient ID: Kelly Li, female    DOB: March 09, 1917, 81 y.o.   MRN: [id number]  Dysuria  Associated symptoms include urgency. Pertinent negatives include no chills, flank pain or hematuria.  Patient presents to the office for  Evaluation of dysuria which has been going on for the last couple months.  It has been getting significantly worse over the past couple weeks.  She reports that she has had to go more frequently and urgently.  She doesn't notice a smell to her urine.  She reports that it has been very cloudy.  She has been drinking plenty of water.  She is avoiding caffeine.  She doesn't have much abdominal pain.  She reports that she has no rash, vaginal discharge, or dryness.      Review of Systems  Constitutional: Negative for fever, chills and fatigue.  Genitourinary: Positive for dysuria and urgency. Negative for hematuria, flank pain, vaginal bleeding, vaginal discharge, difficulty urinating and vaginal pain.       Objective:   Physical Exam  Constitutional: She is oriented to person, place, and time. She appears well-developed and well-nourished. No distress.  HENT:  Head: Normocephalic.  Mouth/Throat: Oropharynx is clear and moist. No oropharyngeal exudate.  Eyes: Conjunctivae are normal. No scleral icterus.  Neck: Normal range of motion. Neck supple. No JVD present. No thyromegaly present.  Cardiovascular: Normal rate, regular rhythm, normal heart sounds and intact distal pulses.  Exam reveals no gallop and no friction rub.   No murmur heard. Pulmonary/Chest: Effort normal and breath sounds normal. No respiratory distress. She has no wheezes. She has no rales. She exhibits no tenderness.  Abdominal: Soft. Bowel sounds are normal. She exhibits no distension and no mass. There is no tenderness. There is no rebound and no guarding.  Musculoskeletal: Normal range of motion.  Lymphadenopathy:    She has no cervical adenopathy.  Neurological: She is alert and  oriented to person, place, and time.  Skin: Skin is warm and dry. She is not diaphoretic.  Psychiatric: She has a normal mood and affect. Her behavior is normal. Judgment and thought content normal.  Nursing note and vitals reviewed.   Filed Vitals:   09/12/15 1436  BP: 142/60  Pulse: 60  Temp: 98.2 F (36.8 C)  Resp: 16        Assessment & Plan:    1. Dysuria  - Urinalysis, Routine w reflex microscopic (not at [facility]) - Culture, Urine - cephALEXin (KEFLEX) 500 MG capsule; Take 1 capsule (500 mg total) by mouth 3 (three) times daily.  Dispense: 21 capsule; Refill: 0  2. Need for prophylactic vaccination and inoculation against influenza  - Flu vaccine HIGH DOSE PF (Fluzone High dose)

## 2015-09-12 NOTE — Patient Instructions (Signed)

## 2015-09-13 LAB — URINALYSIS, ROUTINE W REFLEX MICROSCOPIC
Bilirubin Urine: NEGATIVE
Glucose, UA: NEGATIVE
KETONES UR: NEGATIVE
NITRITE: NEGATIVE
Specific Gravity, Urine: 1.01 (ref 1.001–1.035)
pH: 5.5 (ref 5.0–8.0)

## 2015-09-13 LAB — URINALYSIS, MICROSCOPIC ONLY
Bacteria, UA: NONE SEEN [HPF]
Casts: NONE SEEN [LPF]
Crystals: NONE SEEN [HPF]
RBC / HPF: NONE SEEN RBC/HPF (ref <=2)
Squamous Epithelial / LPF: NONE SEEN [HPF] (ref <=5)
Yeast: NONE SEEN [HPF]

## 2015-09-15 LAB — URINE CULTURE: Colony Count: >=100000

## 2015-10-01 ENCOUNTER — Telehealth: Payer: Self-pay | Admitting: Internal Medicine

## 2015-10-01 MED ORDER — LEVOFLOXACIN 500 MG PO TABS: 500.0000 mg | ORAL_TABLET | Freq: Every day | ORAL | Status: AC

## 2015-10-01 NOTE — Telephone Encounter (Signed)
Patient aware.

## 2015-10-01 NOTE — Telephone Encounter (Signed)
Patient called with return of urinary syptoms similar to visit on 10/5 when she was diagnosed with UTI and also cold and productive cough.  She would like to have some abx.  Most recent culture sensitive to levaquin. Will try 10 days of levaquin.  If no improvement or worsening does need to come into the office.  Keep appointment for urine recheck in 2 weeks.

## 2015-10-15 ENCOUNTER — Ambulatory Visit (INDEPENDENT_AMBULATORY_CARE_PROVIDER_SITE_OTHER): Payer: Medicare Other | Admitting: *Deleted

## 2015-10-15 DIAGNOSIS — N390 Urinary tract infection, site not specified: Secondary | ICD-10-CM | POA: Diagnosis not present

## 2015-10-15 NOTE — Addendum Note (Signed)
Addended by: Estanislao Harmon A on: 10/15/2015 02:06 PM   Modules accepted: Orders

## 2015-10-15 NOTE — Progress Notes (Signed)
Patient ID: Kelly Li, female   DOB: 09-05-1917, 81 y.o.   MRN: [id number] Patient presents for 1 month recheck UA, C&S.  Patient completed abx AD.

## 2015-10-17 LAB — URINE CULTURE: Colony Count: 50000

## 2015-11-16 ENCOUNTER — Ambulatory Visit: Payer: Self-pay | Admitting: Physician Assistant

## 2015-11-28 ENCOUNTER — Other Ambulatory Visit: Payer: Self-pay

## 2015-11-28 ENCOUNTER — Other Ambulatory Visit: Payer: Self-pay | Admitting: *Deleted

## 2015-11-28 MED ORDER — LEVOTHYROXINE SODIUM 100 MCG PO TABS: 100.0000 ug | ORAL_TABLET | Freq: Every day | ORAL | Status: DC

## 2015-12-06 ENCOUNTER — Ambulatory Visit (INDEPENDENT_AMBULATORY_CARE_PROVIDER_SITE_OTHER): Payer: Medicare Other | Admitting: Physician Assistant

## 2015-12-06 ENCOUNTER — Encounter: Payer: Self-pay | Admitting: Physician Assistant

## 2015-12-06 VITALS — BP 122/80 | HR 66 | Temp 97.5°F | Resp 14 | Ht 65.5 in | Wt 125.6 lb

## 2015-12-06 DIAGNOSIS — N289 Disorder of kidney and ureter, unspecified: Secondary | ICD-10-CM | POA: Diagnosis not present

## 2015-12-06 DIAGNOSIS — J45909 Unspecified asthma, uncomplicated: Secondary | ICD-10-CM

## 2015-12-06 DIAGNOSIS — R6889 Other general symptoms and signs: Secondary | ICD-10-CM | POA: Diagnosis not present

## 2015-12-06 DIAGNOSIS — E039 Hypothyroidism, unspecified: Secondary | ICD-10-CM

## 2015-12-06 DIAGNOSIS — N3281 Overactive bladder: Secondary | ICD-10-CM | POA: Diagnosis not present

## 2015-12-06 DIAGNOSIS — Z79899 Other long term (current) drug therapy: Secondary | ICD-10-CM

## 2015-12-06 DIAGNOSIS — E559 Vitamin D deficiency, unspecified: Secondary | ICD-10-CM

## 2015-12-06 DIAGNOSIS — M1990 Unspecified osteoarthritis, unspecified site: Secondary | ICD-10-CM

## 2015-12-06 DIAGNOSIS — H409 Unspecified glaucoma: Secondary | ICD-10-CM | POA: Diagnosis not present

## 2015-12-06 DIAGNOSIS — E785 Hyperlipidemia, unspecified: Secondary | ICD-10-CM | POA: Diagnosis not present

## 2015-12-06 DIAGNOSIS — K219 Gastro-esophageal reflux disease without esophagitis: Secondary | ICD-10-CM

## 2015-12-06 DIAGNOSIS — R7309 Other abnormal glucose: Secondary | ICD-10-CM

## 2015-12-06 DIAGNOSIS — I10 Essential (primary) hypertension: Secondary | ICD-10-CM

## 2015-12-06 DIAGNOSIS — Z0001 Encounter for general adult medical examination with abnormal findings: Secondary | ICD-10-CM

## 2015-12-06 DIAGNOSIS — J0100 Acute maxillary sinusitis, unspecified: Secondary | ICD-10-CM

## 2015-12-06 LAB — CBC WITH DIFFERENTIAL/PLATELET
BASOS ABS: 0 10*3/uL (ref 0.0–0.1)
BASOS PCT: 0 % (ref 0–1)
EOS ABS: 0.1 10*3/uL (ref 0.0–0.7)
EOS PCT: 1 % (ref 0–5)
HCT: 34.1 % — ABNORMAL LOW (ref 36.0–46.0)
Hemoglobin: 11.5 g/dL — ABNORMAL LOW (ref 12.0–15.0)
LYMPHS ABS: 1.1 10*3/uL (ref 0.7–4.0)
Lymphocytes Relative: 16 % (ref 12–46)
MCH: 30 pg (ref 26.0–34.0)
MCHC: 33.7 g/dL (ref 30.0–36.0)
MCV: 89 fL (ref 78.0–100.0)
MONOS PCT: 11 % (ref 3–12)
MPV: 9.9 fL (ref 8.6–12.4)
Monocytes Absolute: 0.8 10*3/uL (ref 0.1–1.0)
NEUTROS PCT: 72 % (ref 43–77)
Neutro Abs: 5 10*3/uL (ref 1.7–7.7)
PLATELETS: 194 10*3/uL (ref 150–400)
RBC: 3.83 MIL/uL — ABNORMAL LOW (ref 3.87–5.11)
RDW: 14.4 % (ref 11.5–15.5)
WBC: 7 10*3/uL (ref 4.0–10.5)

## 2015-12-06 LAB — BASIC METABOLIC PANEL WITH GFR
BUN: 26 mg/dL — AB (ref 7–25)
CALCIUM: 8.9 mg/dL (ref 8.6–10.4)
CHLORIDE: 105 mmol/L (ref 98–110)
CO2: 21 mmol/L (ref 20–31)
CREATININE: 1.35 mg/dL — AB (ref 0.60–0.88)
GFR, Est African American: 38 mL/min — ABNORMAL LOW (ref >=60)
GFR, Est Non African American: 33 mL/min — ABNORMAL LOW (ref >=60)
Glucose, Bld: 106 mg/dL — ABNORMAL HIGH (ref 65–99)
Potassium: 4.4 mmol/L (ref 3.5–5.3)
SODIUM: 139 mmol/L (ref 135–146)

## 2015-12-06 LAB — TSH: TSH: 2.98 u[IU]/mL (ref 0.350–4.500)

## 2015-12-06 LAB — HEPATIC FUNCTION PANEL
ALK PHOS: 51 U/L (ref 33–130)
ALT: 5 U/L — AB (ref 6–29)
AST: 13 U/L (ref 10–35)
Albumin: 3.8 g/dL (ref 3.6–5.1)
BILIRUBIN INDIRECT: 0.5 mg/dL (ref 0.2–1.2)
Bilirubin, Direct: 0.1 mg/dL (ref <=0.2)
TOTAL PROTEIN: 6.4 g/dL (ref 6.1–8.1)
Total Bilirubin: 0.6 mg/dL (ref 0.2–1.2)

## 2015-12-06 LAB — LIPID PANEL
Cholesterol: 138 mg/dL (ref 125–200)
HDL: 54 mg/dL (ref >=46)
LDL CALC: 73 mg/dL (ref <130)
Total CHOL/HDL Ratio: 2.6 Ratio (ref <=5.0)
Triglycerides: 55 mg/dL (ref <150)
VLDL: 11 mg/dL (ref <30)

## 2015-12-06 LAB — MAGNESIUM: Magnesium: 1.9 mg/dL (ref 1.5–2.5)

## 2015-12-06 MED ORDER — ALBUTEROL SULFATE HFA 108 (90 BASE) MCG/ACT IN AERS: 2.0000 | INHALATION_SPRAY | Freq: Four times a day (QID) | RESPIRATORY_TRACT | Status: AC | PRN

## 2015-12-06 MED ORDER — AZITHROMYCIN 250 MG PO TABS: ORAL_TABLET | ORAL | Status: AC

## 2015-12-06 MED ORDER — PREDNISONE 20 MG PO TABS
ORAL_TABLET | ORAL | Status: DC
Start: 2015-12-06 — End: 2016-03-14

## 2015-12-06 NOTE — Patient Instructions (Signed)
Sinusitis can be uncomfortable. People with sinusitis have congestion with yellow/green/gray discharge, sinus pain/pressure, pain around the eyes. Sinus infections almost ALWAYS stem from a viral infection and antibiotics don't work against a virus. Even when bacteria is responsible, the infections usually clear up on their own in a week or so.   PLEASE TRY TO DO OVER THE COUNTER TREATMENT AND PREDNISONE FOR 5-7 DAYS AND IF YOU ARE NOT GETTING BETTER OR GETTING WORSE THEN YOU CAN START ON AN ANTIBIOTIC GIVEN.  Can take the prednisone AT NIGHT WITH DINNER, it take 8-12 hours to start working so it will NOT affect your sleeping if you take it at night with your food!! Take two pills the first night and 1 or two pill the second night and then 1 pill the other nights.   Risk of antibiotic use: About 1 in 4 people who take antibiotics have side effects including stomach problems, dizziness, or rashes. Those problems clear up soon after stopping the drugs, but in rare cases antibiotics can cause severe allergic reaction. Over use of antibiotics also encourages the growth of bacteria that can't be controlled easily with drugs. That makes you more vunerable to antibiotic-resistant infections and undermines the benefits of antibiotics for others.   Waste of Money: Antibiotics often aren't very expensive, but any money spent on unnecessary drugs is money down the drain.   When are antibiotics needed? Only when symptoms last longer than a week.  Start to improve but then worsen again  -It can take up to 2 weeks to feel better.   -If you do not get better in 7-10 days (Have fever, facial pain, dental pain and swelling), then please call the office and it is now appropriate to start an antibiotic.   -Please take Tylenol or Ibuprofen for pain. -Acetaminiphen 325mg orally every 4-6 hours for pain.  Max: 10 per day -Ibuprofen 200mg orally every 6-8 hours for pain.  Take with food to avoid ulcers.   Max 10 per  day  Please pick one of the over the counter allergy medications below and take it once daily for allergies.  Claritin or loratadine cheapest but likely the weakest  Zyrtec or certizine at night because it can make you sleepy The strongest is allegra or fexafinadine  Cheapest at [employer], [employer], [employer]  -While drinking fluids, pinch and hold nose close and swallow.  This will help open up your eustachian tubes to drain the fluid behind your ear drums. -Try steam showers to open your nasal passages.   Drink lots of water to stay hydrated and to thin mucous.  Flonase/Nasonex is to help the inflammation.  Take 2 sprays in each nostril at bedtime.  Make sure you spray towards the outside of each nostril towards the outer corner of your eye, hold nose close and tilt head back.  This will help the medication get into your sinuses.  If you do not like this medication, then use saline nasal sprays same directions as above for Flonase. Stop the medication right away if you get blurring of your vision or nose bleeds.  Sinusitis Sinusitis is redness, soreness, and inflammation of the paranasal sinuses. Paranasal sinuses are air pockets within the bones of your face (beneath the eyes, the middle of the forehead, or above the eyes). In healthy paranasal sinuses, mucus is able to drain out, and air is able to circulate through them by way of your nose. However, when your paranasal sinuses are inflamed, mucus and air can   become trapped. This can allow bacteria and other germs to grow and cause infection. Sinusitis can develop quickly and last only a short time (acute) or continue over a long period (chronic). Sinusitis that lasts for more than 12 weeks is considered chronic.  CAUSES  Causes of sinusitis include: Allergies. Structural abnormalities, such as displacement of the cartilage that separates your nostrils (deviated septum), which can decrease the air flow through your nose and sinuses and affect sinus  drainage. Functional abnormalities, such as when the small hairs (cilia) that line your sinuses and help remove mucus do not work properly or are not present. SIGNS AND SYMPTOMS  Symptoms of acute and chronic sinusitis are the same. The primary symptoms are pain and pressure around the affected sinuses. Other symptoms include: Upper toothache. Earache. Headache. Bad breath. Decreased sense of smell and taste. A cough, which worsens when you are lying flat. Fatigue. Fever. Thick drainage from your nose, which often is green and may contain pus (purulent). Swelling and warmth over the affected sinuses. DIAGNOSIS  Your health care provider will perform a physical exam. During the exam, your health care provider may: Look in your nose for signs of abnormal growths in your nostrils (nasal polyps).  Tap over the affected sinus to check for signs of infection. View the inside of your sinuses (endoscopy) using an imaging device that has a light attached (endoscope). If your health care provider suspects that you have chronic sinusitis, one or more of the following tests may be recommended: Allergy tests. Nasal culture. A sample of mucus is taken from your nose, sent to a lab, and screened for bacteria. Nasal cytology. A sample of mucus is taken from your nose and examined by your health care provider to determine if your sinusitis is related to an allergy. TREATMENT  Most cases of acute sinusitis are related to a viral infection and will resolve on their own within 10 days. Sometimes medicines are prescribed to help relieve symptoms (pain medicine, decongestants, nasal steroid sprays, or saline sprays).  However, for sinusitis related to a bacterial infection, your health care provider will prescribe antibiotic medicines. These are medicines that will help kill the bacteria causing the infection.  Rarely, sinusitis is caused by a fungal infection. In theses cases, your health care provider will  prescribe antifungal medicine. For some cases of chronic sinusitis, surgery is needed. Generally, these are cases in which sinusitis recurs more than 3 times per year, despite other treatments. HOME CARE INSTRUCTIONS  Drink plenty of water. Water helps thin the mucus so your sinuses can drain more easily. Use a humidifier. Inhale steam 3 to 4 times a day (for example, sit in the bathroom with the shower running). Apply a warm, moist washcloth to your face 3 to 4 times a day, or as directed by your health care provider. Use saline nasal sprays to help moisten and clean your sinuses. Take medicines only as directed by your health care provider. If you were prescribed either an antibiotic or antifungal medicine, finish it all even if you start to feel better. SEEK IMMEDIATE MEDICAL CARE IF: You have increasing pain or severe headaches. You have nausea, vomiting, or drowsiness. You have swelling around your face. You have vision problems. You have a stiff neck. You have difficulty breathing. MAKE SURE YOU:  Understand these instructions. Will watch your condition. Will get help right away if you are not doing well or get worse. Document Released: 11/24/2005 Document Revised: 04/10/2014 Document Reviewed: 12/09/2011 ExitCare   Patient Information 2015 [place], [place]. This information is not intended to replace advice given to you by your health care provider. Make sure you discuss any questions you have with your health care provider.   Preventive Care for Adults A healthy lifestyle and preventive care can promote health and wellness. Preventive health guidelines for women include the following key practices.  A routine yearly physical is a good way to check with your health care provider about your health and preventive screening. It is a chance to share any concerns and updates on your health and to receive a thorough exam.  Visit your dentist for a routine exam and preventive care every 6  months. Brush your teeth twice a day and floss once a day. Good oral hygiene prevents tooth decay and gum disease.  The frequency of eye exams is based on your age, health, family medical history, use of contact lenses, and other factors. Follow your health care provider's recommendations for frequency of eye exams.  Eat a healthy diet. Foods like vegetables, fruits, whole grains, low-fat dairy products, and lean protein foods contain the nutrients you need without too many calories. Decrease your intake of foods high in solid fats, added sugars, and salt. Eat the right amount of calories for you.Get information about a proper diet from your health care provider, if necessary.  Regular physical exercise is one of the most important things you can do for your health. Most adults should get at least 150 minutes of moderate-intensity exercise (any activity that increases your heart rate and causes you to sweat) each week. In addition, most adults need muscle-strengthening exercises on 2 or more days a week.  Maintain a healthy weight. The body mass index (BMI) is a screening tool to identify possible weight problems. It provides an estimate of body fat based on height and weight. Your health care provider can find your BMI and can help you achieve or maintain a healthy weight.For adults 20 years and older:  A BMI below 18.5 is considered underweight.  A BMI of 18.5 to 24.9 is normal.  A BMI of 25 to 29.9 is considered overweight.  A BMI of 30 and above is considered obese.  Maintain normal blood lipids and cholesterol levels by exercising and minimizing your intake of saturated fat. Eat a balanced diet with plenty of fruit and vegetables. If your lipid or cholesterol levels are high, you are over 50, or you are at high risk for heart disease, you may need your cholesterol levels checked more frequently.Ongoing high lipid and cholesterol levels should be treated with medicines if diet and exercise  are not working.  If you smoke, find out from your health care provider how to quit. If you do not use tobacco, do not start.  Lung cancer screening is recommended for adults aged 55-80 years who are at high risk for developing lung cancer because of a history of smoking. A yearly low-dose CT scan of the lungs is recommended for people who have at least a 30-pack-year history of smoking and are a current smoker or have quit within the past 15 years. A pack year of smoking is smoking an average of 1 pack of cigarettes a day for 1 year (for example: 1 pack a day for 30 years or 2 packs a day for 15 years). Yearly screening should continue until the smoker has stopped smoking for at least 15 years. Yearly screening should be stopped for people who develop a health problem that would  prevent them from having lung cancer treatment.  Avoid use of street drugs. Do not share needles with anyone. Ask for help if you need support or instructions about stopping the use of drugs.  High blood pressure causes heart disease and increases the risk of stroke.  Ongoing high blood pressure should be treated with medicines if weight loss and exercise do not work.  If you are 23-23 years old, ask your health care provider if you should take aspirin to prevent strokes.  Diabetes screening involves taking a blood sample to check your fasting blood sugar level. This should be done once every 3 years, after age 68, if you are within normal weight and without risk factors for diabetes. Testing should be considered at a younger age or be carried out more frequently if you are overweight and have at least 1 risk factor for diabetes.  Breast cancer screening is essential preventive care for women. You should practice "breast self-awareness." This means understanding the normal appearance and feel of your breasts and may include breast self-examination. Any changes detected, no matter how small, should be reported to a health care  provider. Women in their 87s and 30s should have a clinical breast exam (CBE) by a health care provider as part of a regular health exam every 1 to 3 years. After age 78, women should have a CBE every year. Starting at age 36, women should consider having a mammogram (breast X-ray test) every year. Women who have a family history of breast cancer should talk to their health care provider about genetic screening. Women at a high risk of breast cancer should talk to their health care providers about having an MRI and a mammogram every year.  Breast cancer gene (BRCA)-related cancer risk assessment is recommended for women who have family members with BRCA-related cancers. BRCA-related cancers include breast, ovarian, tubal, and peritoneal cancers. Having family members with these cancers may be associated with an increased risk for harmful changes (mutations) in the breast cancer genes BRCA1 and BRCA2. Results of the assessment will determine the need for genetic counseling and BRCA1 and BRCA2 testing.  Routine pelvic exams to screen for cancer are no longer recommended for nonpregnant women who are considered low risk for cancer of the pelvic organs (ovaries, uterus, and vagina) and who do not have symptoms. Ask your health care provider if a screening pelvic exam is right for you.  If you have had past treatment for cervical cancer or a condition that could lead to cancer, you need Pap tests and screening for cancer for at least 20 years after your treatment. If Pap tests have been discontinued, your risk factors (such as having a new sexual partner) need to be reassessed to determine if screening should be resumed. Some women have medical problems that increase the chance of getting cervical cancer. In these cases, your health care provider may recommend more frequent screening and Pap tests.    Colorectal cancer can be detected and often prevented. Most routine colorectal cancer screening begins at the  age of 66 years and continues through age 24 years. However, your health care provider may recommend screening at an earlier age if you have risk factors for colon cancer. On a yearly basis, your health care provider may provide home test kits to check for hidden blood in the stool. Use of a small camera at the end of a tube, to directly examine the colon (sigmoidoscopy or colonoscopy), can detect the earliest forms of colorectal  cancer. Talk to your health care provider about this at age 76, when routine screening begins. Direct exam of the colon should be repeated every 5-10 years through age 27 years, unless early forms of pre-cancerous polyps or small growths are found.  Osteoporosis is a disease in which the bones lose minerals and strength with aging. This can result in serious bone fractures or breaks. The risk of osteoporosis can be identified using a bone density scan. Women ages 33 years and over and women at risk for fractures or osteoporosis should discuss screening with their health care providers. Ask your health care provider whether you should take a calcium supplement or vitamin D to reduce the rate of osteoporosis.  Menopause can be associated with physical symptoms and risks. Hormone replacement therapy is available to decrease symptoms and risks. You should talk to your health care provider about whether hormone replacement therapy is right for you.  Use sunscreen. Apply sunscreen liberally and repeatedly throughout the day. You should seek shade when your shadow is shorter than you. Protect yourself by wearing long sleeves, pants, a wide-brimmed hat, and sunglasses year round, whenever you are outdoors.  Once a month, do a whole body skin exam, using a mirror to look at the skin on your back. Tell your health care provider of new moles, moles that have irregular borders, moles that are larger than a pencil eraser, or moles that have changed in shape or color.  Stay current with  required vaccines (immunizations).  Influenza vaccine. All adults should be immunized every year.  Tetanus, diphtheria, and acellular pertussis (Td, Tdap) vaccine. Pregnant women should receive 1 dose of Tdap vaccine during each pregnancy. The dose should be obtained regardless of the length of time since the last dose. Immunization is preferred during the 27th-36th week of gestation. An adult who has not previously received Tdap or who does not know her vaccine status should receive 1 dose of Tdap. This initial dose should be followed by tetanus and diphtheria toxoids (Td) booster doses every 10 years. Adults with an unknown or incomplete history of completing a 3-dose immunization series with Td-containing vaccines should begin or complete a primary immunization series including a Tdap dose. Adults should receive a Td booster every 10 years.    Zoster vaccine. One dose is recommended for adults aged 12 years or older unless certain conditions are present.    Pneumococcal 13-valent conjugate (PCV13) vaccine. When indicated, a person who is uncertain of her immunization history and has no record of immunization should receive the PCV13 vaccine. An adult aged 83 years or older who has certain medical conditions and has not been previously immunized should receive 1 dose of PCV13 vaccine. This PCV13 should be followed with a dose of pneumococcal polysaccharide (PPSV23) vaccine. The PPSV23 vaccine dose should be obtained at least 8 weeks after the dose of PCV13 vaccine. An adult aged 33 years or older who has certain medical conditions and previously received 1 or more doses of PPSV23 vaccine should receive 1 dose of PCV13. The PCV13 vaccine dose should be obtained 1 or more years after the last PPSV23 vaccine dose.    Pneumococcal polysaccharide (PPSV23) vaccine. When PCV13 is also indicated, PCV13 should be obtained first. All adults aged 35 years and older should be immunized. An adult younger than  age 65 years who has certain medical conditions should be immunized. Any person who resides in a nursing home or long-term care facility should be immunized. An adult smoker  should be immunized. People with an immunocompromised condition and certain other conditions should receive both PCV13 and PPSV23 vaccines. People with human immunodeficiency virus (HIV) infection should be immunized as soon as possible after diagnosis. Immunization during chemotherapy or radiation therapy should be avoided. Routine use of PPSV23 vaccine is not recommended for American Indians, [place] Natives, or people younger than 65 years unless there are medical conditions that require PPSV23 vaccine. When indicated, people who have unknown immunization and have no record of immunization should receive PPSV23 vaccine. One-time revaccination 5 years after the first dose of PPSV23 is recommended for people aged 19-64 years who have chronic kidney failure, nephrotic syndrome, asplenia, or immunocompromised conditions. People who received 1-2 doses of PPSV23 before age 31 years should receive another dose of PPSV23 vaccine at age 81 years or later if at least 5 years have passed since the previous dose. Doses of PPSV23 are not needed for people immunized with PPSV23 at or after age 64 years.   Preventive Services / Frequency  Ages 86 years and over  Blood pressure check.  Lipid and cholesterol check.  Lung cancer screening. / Every year if you are aged 41-80 years and have a 30-pack-year history of smoking and currently smoke or have quit within the past 15 years. Yearly screening is stopped once you have quit smoking for at least 15 years or develop a health problem that would prevent you from having lung cancer treatment.  Clinical breast exam.** / Every year after age 36 years.  BRCA-related cancer risk assessment.** / For women who have family members with a BRCA-related cancer (breast, ovarian, tubal, or peritoneal  cancers).  Mammogram.** / Every year beginning at age 61 years and continuing for as long as you are in good health. Consult with your health care provider.  Pap test.** / Every 3 years starting at age 70 years through age 57 or 3 years with 3 consecutive normal Pap tests. Testing can be stopped between 65 and 70 years with 3 consecutive normal Pap tests and no abnormal Pap or HPV tests in the past 10 years.  Fecal occult blood test (FOBT) of stool. / Every year beginning at age 35 years and continuing until age 61 years. You may not need to do this test if you get a colonoscopy every 10 years.  Flexible sigmoidoscopy or colonoscopy.** / Every 5 years for a flexible sigmoidoscopy or every 10 years for a colonoscopy beginning at age 53 years and continuing until age 7 years.  Hepatitis C blood test.** / For all people born from [birth year] through 1963 and any individual with known risks for hepatitis C.  Osteoporosis screening.** / A one-time screening for women ages 79 years and over and women at risk for fractures or osteoporosis.  Skin self-exam. / Monthly.  Influenza vaccine. / Every year.  Tetanus, diphtheria, and acellular pertussis (Tdap/Td) vaccine.** / 1 dose of Td every 10 years.  Zoster vaccine.** / 1 dose for adults aged 95 years or older.  Pneumococcal 13-valent conjugate (PCV13) vaccine.** / Consult your health care provider.  Pneumococcal polysaccharide (PPSV23) vaccine.** / 1 dose for all adults aged 52 years and older. Screening for abdominal aortic aneurysm (AAA)  by ultrasound is recommended for people who have history of high blood pressure or who are current or former smokers.

## 2015-12-06 NOTE — Progress Notes (Signed)
Complete Physical  Assessment and Plan: 1. Essential hypertension monitor - CBC with Differential/Platelet - BASIC METABOLIC PANEL WITH GFR - Hepatic function panel  2. Hypothyroidism, unspecified hypothyroidism type Continue medications - TSH  3. Renal insufficiency - BASIC METABOLIC PANEL WITH GFR  4. Hyperlipidemia - Lipid panel  5. Abnormal glucose No need to check at this time  6. Glaucoma Continue eye doctor  7. Vitamin D deficiency - VITAMIN D 25 Hydroxy (Vit-D Deficiency, Fractures)  8. Medication management - Magnesium  9. OAB (overactive bladder) vesicare PRN  10. Osteoarthritis, unspecified osteoarthritis type, unspecified site  11. Gastroesophageal reflux disease, esophagitis presence not specified Continue PPI/H2 blocker, diet discussed  12. Asthma, unspecified asthma severity, uncomplicated proventil  13. Encounter for general adult medical examination with abnormal findings - CBC with Differential/Platelet - BASIC METABOLIC PANEL WITH GFR - Hepatic function panel - TSH - Lipid panel - Magnesium - VITAMIN D 25 Hydroxy (Vit-D Deficiency, Fractures)  14. Acute maxillary sinusitis, recurrence not specified - azithromycin (ZITHROMAX) 250 MG tablet; Take 2 tablets (500 mg) on  Day 1,  followed by 1 tablet (250 mg) once daily on Days 2 through 5.  Dispense: 6 each; Refill: 1 - predniSONE (DELTASONE) 20 MG tablet; 2 tablets daily for 3 days, 1 tablet daily for 4 days.  Dispense: 10 tablet; Refill: 0  Discussed med's effects and SE's. Screening labs and tests as requested with regular follow-up as recommended. Over 40 minutes of exam, counseling, chart review, and complex, high level critical decision making was performed this visit.   HPI  81 y.o. female  presents for a complete physical.  Her blood pressure has been controlled at home, today their BP is BP: 122/80 mmHg She does not workout, does walk to the mailbox. She denies chest pain,  shortness of breath, dizziness. She has been complaining of cold symptoms x-4 days. With headache, sinus congestion, .  She is not on cholesterol medication and denies myalgias. Her cholesterol is at goal. The cholesterol last visit was:   Lab Results  Component Value Date   CHOL 147 08/17/2015   HDL 48 08/17/2015   LDLCALC 75 08/17/2015   TRIG 120 08/17/2015   CHOLHDL 3.1 08/17/2015   Last A2ZA1C in the office was:  Lab Results  Component Value Date   HGBA1C 5.5 08/17/2015  Last GFR: Lab Results  Component Value Date   GFRNONAA 32* 08/17/2015  Patient is on Vitamin D supplement.   Lab Results  Component Value Date   VD25OH 48 08/17/2015  She has lower back and hip pain, had right bursectomy with Dr. Berton LanAllusio 04/2015.  She has urinary incontinence.  She is on thyroid medication. Her medication was not changed last visit.   Lab Results  Component Value Date   TSH 2.751 08/17/2015      Current Medications:  Current Outpatient Prescriptions on File Prior to Visit  Medication Sig Dispense Refill  . acetaminophen (TYLENOL) 650 MG CR tablet Take 650 mg by mouth every 8 (eight) hours as needed for pain.     Marland Kitchen. albuterol (PROVENTIL HFA;VENTOLIN HFA) 108 (90 BASE) MCG/ACT inhaler Inhale 2 puffs into the lungs every 6 (six) hours as needed. Shortness of breath 1 Inhaler 3  . bimatoprost (LUMIGAN) 0.01 % SOLN Place 1 drop into both eyes at bedtime.    . cholecalciferol (VITAMIN D) 1000 UNITS tablet Take 2,000 Units by mouth every morning.     . fluticasone (FLONASE) 50 MCG/ACT nasal spray Place 2 sprays  into both nostrils daily as needed for allergies or rhinitis.     Marland Kitchen latanoprost (XALATAN) 0.005 % ophthalmic solution Place 1 drop into the left eye at bedtime.  22  . levothyroxine (SYNTHROID, LEVOTHROID) 100 MCG tablet Take 1 tablet (100 mcg total) by mouth daily. 90 tablet 0  . pantoprazole (PROTONIX) 40 MG tablet TAKE 1 TABLET EVERY DAY FOR ACID REFLUX (Patient taking differently: TAKE 1  TABLET EVERY DAY FOR ACID REFLUX(pt no longer taking)) 90 tablet 1  . ranitidine (ZANTAC) 300 MG tablet Take twice a day while trying to get off PPI, then can go to once at night (Patient taking differently: Take 300 mg by mouth 2 (two) times daily. ) 60 tablet 3  . timolol (TIMOPTIC-XR) 0.5 % ophthalmic gel-forming Place 1 drop into the left eye every morning.  3  . VESICARE 5 MG tablet TAKE 1 TABLET BY MOUTH EVERY DAY FOR OVER ACTIVE BLADDER 30 tablet 3   No current facility-administered medications on file prior to visit.   Health Maintenance:   Immunization History  Administered Date(s) Administered  . Influenza, High Dose Seasonal PF 08/28/2014, 09/12/2015  . Influenza-Unspecified 08/16/2013  . Pneumococcal Conjugate-13 08/28/2014  . Pneumococcal Polysaccharide-23 07/08/2001  . Tdap 04/07/2013   Last colonoscopy: 2003 declines another Last mammogram: declines Last pap smear/pelvic exam: declines  DEXA: 2009 declines  Prior vaccinations: TD or Tdap: 2014 Influenza: 2014 Prevnar 13: 2015 Pneumococcal: 2002  Shingles/Zostavax: declines  Last Dental Exam: Dr. Dahlia Client q 6 months Last Eye Exam: Dr. Nelle Don April 2016 Patient Care Team: Lucky Cowboy, MD as PCP - General (Internal Medicine)  Allergies:  Allergies  Allergen Reactions  . Codeine Anaphylaxis    unknown  . Morphine And Related Nausea And Vomiting  . Nsaids    Medical History:  Past Medical History  Diagnosis Date  . Asthma   . Hypertension   . GERD (gastroesophageal reflux disease)   . Vitamin D deficiency   . Hyperlipidemia   . Renal insufficiency   . Glaucoma    Surgical History:  Past Surgical History  Procedure Laterality Date  . Hip surgery    . Thyroid surgery    . Esophagogastroduodenoscopy  2009  . Nephrectomy  2003  . Colonoscopy  2003  . Breast biopsy Right 1997  . Cataract extraction, bilateral Bilateral   . Excision/release bursa hip Right 04/23/2015    Procedure: RIGHT  HIP BURSECTOMY, EXCISION OF HETEROTOPIC BONE ;  Surgeon: Ollen Gross, MD;  Location: [facility];  Service: Orthopedics;  Laterality: Right;   Family History:  Family History  Problem Relation Age of Onset  . Cancer Mother     breast  . Cancer Father     prostate   Social History:  Social History  Substance Use Topics  . Smoking status: Never Smoker   . Smokeless tobacco: None  . Alcohol Use: No    Review of Systems: Review of Systems  Constitutional: Negative for fever, chills, malaise/fatigue and diaphoresis.  HENT: Positive for congestion and sore throat. Negative for ear discharge, ear pain, hearing loss, nosebleeds and tinnitus.   Eyes: Negative.   Respiratory: Positive for cough, sputum production and wheezing. Negative for hemoptysis, shortness of breath and stridor.   Cardiovascular: Negative.  Negative for chest pain and leg swelling.  Gastrointestinal: Negative.   Genitourinary: Negative.  Negative for dysuria, hematuria and flank pain.  Musculoskeletal: Positive for myalgias and joint pain (right hip). Negative for back pain, falls and neck pain.  Skin: Negative.   Neurological: Negative.  Negative for headaches.  Psychiatric/Behavioral: Negative.     Physical Exam: Estimated body mass index is 20.58 kg/(m^2) as calculated from the following:   Height as of this encounter: 5' 5.5" (1.664 m).   Weight as of this encounter: 125 lb 9.6 oz (56.972 kg). BP 122/80 mmHg  Pulse 66  Temp(Src) 97.5 F (36.4 C) (Temporal)  Resp 14  Ht 5' 5.5" (1.664 m)  Wt 125 lb 9.6 oz (56.972 kg)  BMI 20.58 kg/m2  SpO2 96% HEENT: normocephalic, sclerae anicteric, TMs pearly, nares patent, no discharge or erythema, pharynx normal Oral cavity: MMM, no lesions Neck: supple, no lymphadenopathy, no thyromegaly, no masses Heart: RRR, occ extrasystole beat, normal S1, S2, no murmurs Lungs: CTA bilaterally with wheezing, no rhonchi, or rales Abdomen: +bs, soft, epigastric tenderness without  rebound, non distended, no masses, no hepatomegaly, no splenomegaly Musculoskeletal: nontender, no swelling, no obvious deformity Extremities: no edema, no cyanosis, no clubbing Pulses: 2+ symmetric, upper and lower extremities, normal cap refill Neurological: alert, oriented x 3, CN2-12 intact, strength normal upper extremities and lower extremities, sensation normal throughout, DTRs 2+ throughout, no cerebellar signs, gait slow with walker Psychiatric: normal affect, behavior normal, pleasant    Quentin Mulling 9:35 AM [facility]

## 2015-12-07 LAB — VITAMIN D 25 HYDROXY (VIT D DEFICIENCY, FRACTURES): VIT D 25 HYDROXY: 61 ng/mL (ref 30–100)

## 2016-02-15 ENCOUNTER — Ambulatory Visit: Payer: Self-pay | Admitting: Physician Assistant

## 2016-02-29 ENCOUNTER — Other Ambulatory Visit: Payer: Self-pay | Admitting: Internal Medicine

## 2016-03-14 ENCOUNTER — Encounter: Payer: Self-pay | Admitting: Physician Assistant

## 2016-03-14 ENCOUNTER — Ambulatory Visit (INDEPENDENT_AMBULATORY_CARE_PROVIDER_SITE_OTHER): Payer: Medicare Other | Admitting: Physician Assistant

## 2016-03-14 VITALS — BP 140/80 | HR 56 | Temp 97.2°F | Resp 14 | Ht 65.0 in | Wt 126.8 lb

## 2016-03-14 DIAGNOSIS — R300 Dysuria: Secondary | ICD-10-CM

## 2016-03-14 DIAGNOSIS — Z79899 Other long term (current) drug therapy: Secondary | ICD-10-CM

## 2016-03-14 DIAGNOSIS — E785 Hyperlipidemia, unspecified: Secondary | ICD-10-CM | POA: Diagnosis not present

## 2016-03-14 DIAGNOSIS — E559 Vitamin D deficiency, unspecified: Secondary | ICD-10-CM | POA: Diagnosis not present

## 2016-03-14 DIAGNOSIS — I10 Essential (primary) hypertension: Secondary | ICD-10-CM

## 2016-03-14 DIAGNOSIS — E039 Hypothyroidism, unspecified: Secondary | ICD-10-CM

## 2016-03-14 LAB — CBC WITH DIFFERENTIAL/PLATELET
BASOS PCT: 0 %
Basophils Absolute: 0 cells/uL (ref 0–200)
EOS PCT: 2 %
Eosinophils Absolute: 94 cells/uL (ref 15–500)
HCT: 35.1 % (ref 35.0–45.0)
Hemoglobin: 11.3 g/dL — ABNORMAL LOW (ref 11.7–15.5)
Lymphocytes Relative: 42 %
Lymphs Abs: 1974 cells/uL (ref 850–3900)
MCH: 29.6 pg (ref 27.0–33.0)
MCHC: 32.2 g/dL (ref 32.0–36.0)
MCV: 91.9 fL (ref 80.0–100.0)
MONO ABS: 658 {cells}/uL (ref 200–950)
MONOS PCT: 14 %
MPV: 10.5 fL (ref 7.5–12.5)
Neutro Abs: 1974 cells/uL (ref 1500–7800)
Neutrophils Relative %: 42 %
PLATELETS: 199 10*3/uL (ref 140–400)
RBC: 3.82 MIL/uL (ref 3.80–5.10)
RDW: 14.5 % (ref 11.0–15.0)
WBC: 4.7 10*3/uL (ref 3.8–10.8)

## 2016-03-14 LAB — HEPATIC FUNCTION PANEL
ALT: 5 U/L — ABNORMAL LOW (ref 6–29)
AST: 12 U/L (ref 10–35)
Albumin: 4 g/dL (ref 3.6–5.1)
Alkaline Phosphatase: 45 U/L (ref 33–130)
BILIRUBIN INDIRECT: 0.5 mg/dL (ref 0.2–1.2)
BILIRUBIN TOTAL: 0.6 mg/dL (ref 0.2–1.2)
Bilirubin, Direct: 0.1 mg/dL (ref <=0.2)
TOTAL PROTEIN: 6.4 g/dL (ref 6.1–8.1)

## 2016-03-14 LAB — BASIC METABOLIC PANEL WITH GFR
BUN: 32 mg/dL — ABNORMAL HIGH (ref 7–25)
CHLORIDE: 105 mmol/L (ref 98–110)
CO2: 23 mmol/L (ref 20–31)
CREATININE: 1.36 mg/dL — AB (ref 0.60–0.88)
Calcium: 9.1 mg/dL (ref 8.6–10.4)
GFR, EST NON AFRICAN AMERICAN: 33 mL/min — AB (ref >=60)
GFR, Est African American: 38 mL/min — ABNORMAL LOW (ref >=60)
Glucose, Bld: 92 mg/dL (ref 65–99)
POTASSIUM: 4.8 mmol/L (ref 3.5–5.3)
SODIUM: 141 mmol/L (ref 135–146)

## 2016-03-14 LAB — MAGNESIUM: MAGNESIUM: 1.9 mg/dL (ref 1.5–2.5)

## 2016-03-14 LAB — TSH: TSH: 1.46 mIU/L

## 2016-03-14 NOTE — Patient Instructions (Signed)
Claritin or loratadine cheapest but likely the weakest     Fall Prevention in the Home  Falls can cause injuries and can affect people from all age groups. There are many simple things that you can do to make your home safe and to help prevent falls. WHAT CAN I DO ON THE OUTSIDE OF MY HOME?  Regularly repair the edges of walkways and driveways and fix any cracks.  Remove high doorway thresholds.  Trim any shrubbery on the main path into your home.  Use bright outdoor lighting.  Clear walkways of debris and clutter, including tools and rocks.  Regularly check that handrails are securely fastened and in good repair. Both sides of any steps should have handrails.  Install guardrails along the edges of any raised decks or porches.  Have leaves, snow, and ice cleared regularly.  Use sand or salt on walkways during winter months.  In the garage, clean up any spills right away, including grease or oil spills. WHAT CAN I DO IN THE BATHROOM?  Use night lights.  Install grab bars by the toilet and in the tub and shower. Do not use towel bars as grab bars.  Use non-skid mats or decals on the floor of the tub or shower.  If you need to sit down while you are in the shower, use a plastic, non-slip stool.Marland Kitchen  Keep the floor dry. Immediately clean up any water that spills on the floor.  Remove soap buildup in the tub or shower on a regular basis.  Attach bath mats securely with double-sided non-slip rug tape.  Remove throw rugs and other tripping hazards from the floor. WHAT CAN I DO IN THE BEDROOM?  Use night lights.  Make sure that a bedside light is easy to reach.  Do not use oversized bedding that drapes onto the floor.  Have a firm chair that has side arms to use for getting dressed.  Remove throw rugs and other tripping hazards from the floor. WHAT CAN I DO IN THE KITCHEN?   Clean up any spills right away.  Avoid walking on wet floors.  Place frequently used items  in easy-to-reach places.  If you need to reach for something above you, use a sturdy step stool that has a grab bar.  Keep electrical cables out of the way.  Do not use floor polish or wax that makes floors slippery. If you have to use wax, make sure that it is non-skid floor wax.  Remove throw rugs and other tripping hazards from the floor. WHAT CAN I DO IN THE STAIRWAYS?  Do not leave any items on the stairs.  Make sure that there are handrails on both sides of the stairs. Fix handrails that are broken or loose. Make sure that handrails are as long as the stairways.  Check any carpeting to make sure that it is firmly attached to the stairs. Fix any carpet that is loose or worn.  Avoid having throw rugs at the top or bottom of stairways, or secure the rugs with carpet tape to prevent them from moving.  Make sure that you have a light switch at the top of the stairs and the bottom of the stairs. If you do not have them, have them installed. WHAT ARE SOME OTHER FALL PREVENTION TIPS?  Wear closed-toe shoes that fit well and support your feet. Wear shoes that have rubber soles or low heels.  When you use a stepladder, make sure that it is completely opened and that  the sides are firmly locked. Have someone hold the ladder while you are using it. Do not climb a closed stepladder.  Add color or contrast paint or tape to grab bars and handrails in your home. Place contrasting color strips on the first and last steps.  Use mobility aids as needed, such as canes, walkers, scooters, and crutches.  Turn on lights if it is dark. Replace any light bulbs that burn out.  Set up furniture so that there are clear paths. Keep the furniture in the same spot.  Fix any uneven floor surfaces.  Choose a carpet design that does not hide the edge of steps of a stairway.  Be aware of any and all pets.  Review your medicines with your healthcare provider. Some medicines can cause dizziness or changes  in blood pressure, which increase your risk of falling. Talk with your health care provider about other ways that you can decrease your risk of falls. This may include working with a physical therapist or trainer to improve your strength, balance, and endurance.   This information is not intended to replace advice given to you by your health care provider. Make sure you discuss any questions you have with your health care provider.   Document Released: 11/14/2002 Document Revised: 04/10/2015 Document Reviewed: 12/29/2014 Elsevier Interactive Patient Education [employer]2016 [employer] Inc.

## 2016-03-14 NOTE — Progress Notes (Signed)
Assessment and Plan:  Hypertension: Continue medication, monitor blood pressure at home. Continue DASH diet.  Reminder to go to the ER if any CP, SOB, nausea, dizziness, severe HA, changes vision/speech, left arm numbness and tingling, and jaw pain. Cholesterol: Continue diet and exercise. Check cholesterol.  Hypothyroidism-check TSH level, continue medications the same, reminded to take on an empty stomach 30-65mins before food.  Vitamin D Def- check level and continue medications.  Bilateral hip pain- states "not slowing her down" at this time, we will monitor, discussed prednisone Dysruia-? Vaginal atrophy versus UTI- get urine   Continue diet and meds as discussed. Further disposition pending results of labs. Future Appointments Date Time Provider Department Center  05/02/2016 9:30 AM Quentin Mulling, PA-C [id number] None    HPI 82 y.o. female  presents for 3 month follow up with hypertension, hyperlipidemia, prediabetes and vitamin D.  Her blood pressure has been controlled at home, today their BP is BP: 140/80 mmHg  She does not workout, walking with walker.  She denies chest pain, shortness of breath, dizziness.  She is not on cholesterol medication and denies myalgias. Her cholesterol is at goal. The cholesterol last visit was:   Lab Results  Component Value Date   CHOL 138 12/06/2015   HDL 54 12/06/2015   LDLCALC 73 12/06/2015   TRIG 55 12/06/2015   CHOLHDL 2.6 12/06/2015  Last A1C in the office was:  Lab Results  Component Value Date   HGBA1C 5.5 08/17/2015  Patient is on Vitamin D supplement.   Lab Results  Component Value Date   VD25OH 36 12/06/2015  She has had a fracture and had surgery in 2012 on right hip, s/p right hip bursectomy in 04/2015. Continue to have bilateral hip pain. States eye sight is failing, not as well, does not see eye doctor until August. Enos Fling is here with her, Andrey Campanile her daughter could not be here. She is still at home, not driving.  She has  GERD and is on protonix.  She is on thyroid medication. Her medication was not changed last visit.   Lab Results  Component Value Date   TSH 2.980 12/06/2015  .   Current Medications:  Current Outpatient Prescriptions on File Prior to Visit  Medication Sig Dispense Refill  . acetaminophen (TYLENOL) 650 MG CR tablet Take 650 mg by mouth every 8 (eight) hours as needed for pain.     Marland Kitchen albuterol (PROVENTIL HFA;VENTOLIN HFA) 108 (90 Base) MCG/ACT inhaler Inhale 2 puffs into the lungs every 6 (six) hours as needed. Shortness of breath 1 Inhaler 3  . bimatoprost (LUMIGAN) 0.01 % SOLN Place 1 drop into both eyes at bedtime.    . cholecalciferol (VITAMIN D) 1000 UNITS tablet Take 2,000 Units by mouth every morning.     . fluticasone (FLONASE) 50 MCG/ACT nasal spray Place 2 sprays into both nostrils daily as needed for allergies or rhinitis.     Marland Kitchen latanoprost (XALATAN) 0.005 % ophthalmic solution Place 1 drop into the left eye at bedtime.  22  . levothyroxine (SYNTHROID, LEVOTHROID) 100 MCG tablet TAKE ONE TABLET BY MOUTH ONCE DAILY 90 tablet 1  . pantoprazole (PROTONIX) 40 MG tablet TAKE 1 TABLET EVERY DAY FOR ACID REFLUX (Patient taking differently: TAKE 1 TABLET EVERY DAY FOR ACID REFLUX(pt no longer taking)) 90 tablet 1  . ranitidine (ZANTAC) 300 MG tablet Take twice a day while trying to get off PPI, then can go to once at night (Patient taking differently: Take 300 mg  by mouth 2 (two) times daily. ) 60 tablet 3  . timolol (TIMOPTIC-XR) 0.5 % ophthalmic gel-forming Place 1 drop into the left eye every morning.  3  . VESICARE 5 MG tablet TAKE 1 TABLET BY MOUTH EVERY DAY FOR OVER ACTIVE BLADDER 30 tablet 3   No current facility-administered medications on file prior to visit.   Medical History:  Past Medical History  Diagnosis Date  . Asthma   . Hypertension   . GERD (gastroesophageal reflux disease)   . Vitamin D deficiency   . Hyperlipidemia   . Renal insufficiency   . Glaucoma     Allergies:  Allergies  Allergen Reactions  . Codeine Anaphylaxis    unknown  . Morphine And Related Nausea And Vomiting  . Nsaids      Review of Systems:  Review of Systems  Constitutional: Negative.   HENT: Positive for congestion. Negative for ear discharge, ear pain, hearing loss, nosebleeds, sore throat and tinnitus.   Respiratory: Negative.  Negative for stridor.   Cardiovascular: Negative.   Genitourinary: Positive for dysuria (x 1 week), urgency and frequency. Negative for hematuria and flank pain.  Musculoskeletal: Positive for myalgias and joint pain (bilateral hip, walks with walker). Negative for back pain, falls and neck pain.  Skin: Negative.   Neurological: Negative.  Negative for headaches.  Psychiatric/Behavioral: Negative.     Family history- Review and unchanged Social history- Review and unchanged Physical Exam: BP 140/80 mmHg  Pulse 56  Temp(Src) 97.2 F (36.2 C) (Temporal)  Resp 14  Ht 5\' 5"  (1.651 m)  Wt 126 lb 12.8 oz (57.516 kg)  BMI 21.10 kg/m2  SpO2 98% Wt Readings from Last 3 Encounters:  03/14/16 126 lb 12.8 oz (57.516 kg)  12/06/15 125 lb 9.6 oz (56.972 kg)  09/12/15 127 lb (57.607 kg)   HEENT: normocephalic, sclerae anicteric, TMs pearly, nares patent, no discharge or erythema, pharynx normal Oral cavity: MMM, no lesions Neck: supple, no lymphadenopathy, no thyromegaly, no masses Heart: RRR, normal S1, S2, no murmurs Lungs: CTA bilaterally, no wheezes, rhonchi, or rales Abdomen: +bs, soft, nontender without rebound, non distended, no masses, no hepatomegaly, no splenomegaly Musculoskeletal: nontender, no swelling, no obvious deformity Extremities: no edema, no cyanosis, no clubbing Pulses: 2+ symmetric, upper and lower extremities, normal cap refill Neurological: alert, oriented x 3, CN2-12 intact, strength normal upper extremities and lower extremities, sensation normal throughout, DTRs 2+ throughout, no cerebellar signs, gait  normal. Psychiatric: normal affect, behavior normal, pleasant    Quentin MullingAmanda Kerwin Augustus, PA-C 10:40 AM [facility]

## 2016-03-15 LAB — URINALYSIS, ROUTINE W REFLEX MICROSCOPIC
BILIRUBIN URINE: NEGATIVE
GLUCOSE, UA: NEGATIVE
Hgb urine dipstick: NEGATIVE
Ketones, ur: NEGATIVE
Leukocytes, UA: NEGATIVE
Nitrite: NEGATIVE
PH: 5 (ref 5.0–8.0)
Protein, ur: NEGATIVE
SPECIFIC GRAVITY, URINE: 1.017 (ref 1.001–1.035)

## 2016-03-15 LAB — URINE CULTURE: Colony Count: 75000

## 2016-05-02 ENCOUNTER — Encounter: Payer: Self-pay | Admitting: Physician Assistant

## 2016-07-02 ENCOUNTER — Telehealth: Payer: Self-pay | Admitting: *Deleted

## 2016-07-02 ENCOUNTER — Other Ambulatory Visit: Payer: Self-pay | Admitting: *Deleted

## 2016-07-02 MED ORDER — AZITHROMYCIN 250 MG PO TABS: ORAL_TABLET | ORAL | 0 refills | Status: AC

## 2016-07-02 NOTE — Telephone Encounter (Signed)
Patient called and requested an RX for sinus and chest congestion.  Per Dr Oneta Rack, it is OK to send in a Z-pak.  Patient is aware.

## 2016-07-17 ENCOUNTER — Encounter: Payer: Self-pay | Admitting: Physician Assistant

## 2016-07-17 ENCOUNTER — Ambulatory Visit (INDEPENDENT_AMBULATORY_CARE_PROVIDER_SITE_OTHER): Payer: Medicare Other | Admitting: Physician Assistant

## 2016-07-17 VITALS — BP 120/66 | HR 61 | Temp 97.9°F | Resp 14 | Ht 65.0 in | Wt 122.8 lb

## 2016-07-17 DIAGNOSIS — K219 Gastro-esophageal reflux disease without esophagitis: Secondary | ICD-10-CM

## 2016-07-17 DIAGNOSIS — G8929 Other chronic pain: Secondary | ICD-10-CM | POA: Diagnosis not present

## 2016-07-17 DIAGNOSIS — H409 Unspecified glaucoma: Secondary | ICD-10-CM | POA: Diagnosis not present

## 2016-07-17 DIAGNOSIS — I10 Essential (primary) hypertension: Secondary | ICD-10-CM

## 2016-07-17 DIAGNOSIS — R6889 Other general symptoms and signs: Secondary | ICD-10-CM | POA: Diagnosis not present

## 2016-07-17 DIAGNOSIS — E039 Hypothyroidism, unspecified: Secondary | ICD-10-CM

## 2016-07-17 DIAGNOSIS — E559 Vitamin D deficiency, unspecified: Secondary | ICD-10-CM | POA: Diagnosis not present

## 2016-07-17 DIAGNOSIS — M533 Sacrococcygeal disorders, not elsewhere classified: Secondary | ICD-10-CM | POA: Diagnosis not present

## 2016-07-17 DIAGNOSIS — J45909 Unspecified asthma, uncomplicated: Secondary | ICD-10-CM | POA: Diagnosis not present

## 2016-07-17 DIAGNOSIS — I700 Atherosclerosis of aorta: Secondary | ICD-10-CM | POA: Diagnosis not present

## 2016-07-17 DIAGNOSIS — M1990 Unspecified osteoarthritis, unspecified site: Secondary | ICD-10-CM

## 2016-07-17 DIAGNOSIS — Z0000 Encounter for general adult medical examination without abnormal findings: Secondary | ICD-10-CM

## 2016-07-17 DIAGNOSIS — N183 Chronic kidney disease, stage 3 unspecified: Secondary | ICD-10-CM | POA: Insufficient documentation

## 2016-07-17 DIAGNOSIS — E785 Hyperlipidemia, unspecified: Secondary | ICD-10-CM

## 2016-07-17 DIAGNOSIS — E441 Mild protein-calorie malnutrition: Secondary | ICD-10-CM

## 2016-07-17 DIAGNOSIS — Z0001 Encounter for general adult medical examination with abnormal findings: Secondary | ICD-10-CM

## 2016-07-17 DIAGNOSIS — N3281 Overactive bladder: Secondary | ICD-10-CM

## 2016-07-17 DIAGNOSIS — Z79899 Other long term (current) drug therapy: Secondary | ICD-10-CM

## 2016-07-17 LAB — HEPATIC FUNCTION PANEL
ALK PHOS: 56 U/L (ref 33–130)
ALT: 5 U/L — AB (ref 6–29)
AST: 12 U/L (ref 10–35)
Albumin: 3.9 g/dL (ref 3.6–5.1)
BILIRUBIN DIRECT: 0.2 mg/dL (ref <=0.2)
BILIRUBIN INDIRECT: 0.6 mg/dL (ref 0.2–1.2)
TOTAL PROTEIN: 6.5 g/dL (ref 6.1–8.1)
Total Bilirubin: 0.8 mg/dL (ref 0.2–1.2)

## 2016-07-17 LAB — CBC WITH DIFFERENTIAL/PLATELET
BASOS PCT: 1 %
Basophils Absolute: 42 cells/uL (ref 0–200)
EOS PCT: 3 %
Eosinophils Absolute: 126 cells/uL (ref 15–500)
HEMATOCRIT: 37.2 % (ref 35.0–45.0)
HEMOGLOBIN: 11.9 g/dL (ref 11.7–15.5)
LYMPHS ABS: 1260 {cells}/uL (ref 850–3900)
Lymphocytes Relative: 30 %
MCH: 30.1 pg (ref 27.0–33.0)
MCHC: 32 g/dL (ref 32.0–36.0)
MCV: 93.9 fL (ref 80.0–100.0)
MONO ABS: 546 {cells}/uL (ref 200–950)
MPV: 9.8 fL (ref 7.5–12.5)
Monocytes Relative: 13 %
NEUTROS ABS: 2226 {cells}/uL (ref 1500–7800)
Neutrophils Relative %: 53 %
Platelets: 257 10*3/uL (ref 140–400)
RBC: 3.96 MIL/uL (ref 3.80–5.10)
RDW: 14.5 % (ref 11.0–15.0)
WBC: 4.2 10*3/uL (ref 3.8–10.8)

## 2016-07-17 LAB — BASIC METABOLIC PANEL WITH GFR
BUN: 31 mg/dL — ABNORMAL HIGH (ref 7–25)
CALCIUM: 9.2 mg/dL (ref 8.6–10.4)
CO2: 26 mmol/L (ref 20–31)
Chloride: 108 mmol/L (ref 98–110)
Creat: 1.46 mg/dL — ABNORMAL HIGH (ref 0.60–0.88)
GFR, EST AFRICAN AMERICAN: 35 mL/min — AB (ref >=60)
GFR, EST NON AFRICAN AMERICAN: 30 mL/min — AB (ref >=60)
GLUCOSE: 104 mg/dL — AB (ref 65–99)
POTASSIUM: 4.6 mmol/L (ref 3.5–5.3)
SODIUM: 143 mmol/L (ref 135–146)

## 2016-07-17 LAB — MAGNESIUM: MAGNESIUM: 2.1 mg/dL (ref 1.5–2.5)

## 2016-07-17 LAB — TSH: TSH: 2.87 mIU/L

## 2016-07-17 MED ORDER — DEXAMETHASONE SODIUM PHOSPHATE 100 MG/10ML IJ SOLN
10.0000 mg | Freq: Once | INTRAMUSCULAR | Status: AC
  Administered 2016-07-17: 10 mg via INTRAMUSCULAR

## 2016-07-17 NOTE — Progress Notes (Signed)
Medicare visit and CPE Assessment:   Hypertension - CBC with Differential - BASIC METABOLIC PANEL WITH GFR - Hepatic function panel - Microalbumin / creatinine urine ratio   Unspecified hypothyroidism - TSH  CKD stage 3 Check BMP, continue to avoid NSAIDS, drink water  Vitamin D deficiency - Vit D  25 hydroxy (rtn osteoporosis monitoring)   Hyperlipidemia - Lipid panel  Encounter for long-term (current) use of other medications - Magnesium   GERD Continue medications  Atherosclerosis of aorta (HCC) Control blood pressure, cholesterol, glucose, increase exercise.   Asthma, unspecified asthma severity, uncomplicated Avoid triggers  Osteoarthritis, unspecified osteoarthritis type, unspecified site Continue exercise, tyleneol.   OAB (overactive bladder) Will stop medication due to fall risk, understands that with her age will have incontinence.   Glaucoma Continue eye doctor  Medicare annual wellness visit, subsequent At this time the patient does not want any invasive test or procedures, She is on minimal medications, has not had any falls and has a good support system.   Mild malnutrition (HCC) Ensure samples given -     Hepatic function panel  Chronic right SI joint pain Trigger point infection given, patient tolerated well, continue tylenol -     dexamethasone (DECADRON) injection 10 mg; Inject 1 mL (10 mg total) into the muscle once.   No future appointments.    Plan:   During the course of the visit the patient was educated and counseled about appropriate screening and preventive services including:    Pneumococcal vaccine   Influenza vaccine  Td vaccine  Screening electrocardiogram  Screening mammography  Bone densitometry screening  Colorectal cancer screening  Diabetes screening  Glaucoma screening  Nutrition counseling    Subjective:   Kelly Li is a 82 y.o. female who presents for Medicare Annual Wellness Visit and 3  month follow up.   Her blood pressure has been controlled at home, today their BP is BP: 120/66 She does not workout, but does walk to and from her mailbox, walks in grocery store, walks with granddaugter and great grandson, active at home. She denies chest pain, shortness of breath, dizziness. Uses proventil occ but otherwise normal.  She is not on cholesterol medication and denies myalgias. Her cholesterol is at goal. The cholesterol last visit was:   Lab Results  Component Value Date   CHOL 138 12/06/2015   HDL 54 12/06/2015   LDLCALC 73 12/06/2015   TRIG 55 12/06/2015   CHOLHDL 2.6 12/06/2015   Last J4N in the office was:  Lab Results  Component Value Date   HGBA1C 5.5 08/17/2015   Lab Results  Component Value Date   GFRNONAA 33 (L) 03/14/2016   Patient is on Vitamin D supplement.   She has lower back pain and hip pain occ, had right hip bursectomy with Dr. Berton Lan 04/23/2015.  Has some lower back pain at right SI and anterior leg pain occ she has a catch and feels like it gives out on her. Walking with a walker. She has epigastric tenderness that occ goes to her back, and has some GERD symptoms She has urinary incontinence, she is not on vesicare at this time. .  Daughter, Andrey Campanile, has pneumonia at this time, patient is here by herself, neighbor brought her.   She is on thyroid medication. Her medication was not changed last visit.   Lab Results  Component Value Date   TSH 1.46 03/14/2016  .  BMI is Body mass index is 20.43 kg/m., she drinks ensure  Wt Readings from Last 3 Encounters:  07/17/16 122 lb 12.8 oz (55.7 kg)  03/14/16 126 lb 12.8 oz (57.5 kg)  12/06/15 125 lb 9.6 oz (57 kg)    Medication Review Current Outpatient Prescriptions on File Prior to Visit  Medication Sig Dispense Refill  . acetaminophen (TYLENOL) 650 MG CR tablet Take 650 mg by mouth every 8 (eight) hours as needed for pain.     Marland Kitchen albuterol (PROVENTIL HFA;VENTOLIN HFA) 108 (90 Base) MCG/ACT inhaler  Inhale 2 puffs into the lungs every 6 (six) hours as needed. Shortness of breath 1 Inhaler 3  . bimatoprost (LUMIGAN) 0.01 % SOLN Place 1 drop into both eyes at bedtime.    . cholecalciferol (VITAMIN D) 1000 UNITS tablet Take 2,000 Units by mouth every morning.     . fluticasone (FLONASE) 50 MCG/ACT nasal spray Place 2 sprays into both nostrils daily as needed for allergies or rhinitis.     Marland Kitchen latanoprost (XALATAN) 0.005 % ophthalmic solution Place 1 drop into the left eye at bedtime.  22  . levothyroxine (SYNTHROID, LEVOTHROID) 100 MCG tablet TAKE ONE TABLET BY MOUTH ONCE DAILY 90 tablet 1  . pantoprazole (PROTONIX) 40 MG tablet TAKE 1 TABLET EVERY DAY FOR ACID REFLUX (Patient taking differently: TAKE 1 TABLET EVERY DAY FOR ACID REFLUX(pt no longer taking)) 90 tablet 1  . ranitidine (ZANTAC) 300 MG tablet Take twice a day while trying to get off PPI, then can go to once at night (Patient taking differently: Take 300 mg by mouth 2 (two) times daily. ) 60 tablet 3  . timolol (TIMOPTIC-XR) 0.5 % ophthalmic gel-forming Place 1 drop into the left eye every morning.  3  . VESICARE 5 MG tablet TAKE 1 TABLET BY MOUTH EVERY DAY FOR OVER ACTIVE BLADDER 30 tablet 3   No current facility-administered medications on file prior to visit.     Current Problems (verified) Patient Active Problem List   Diagnosis Date Noted  . Atherosclerosis of aorta (HCC) 07/17/2016  . CKD (chronic kidney disease) stage 3, GFR 30-59 ml/min 07/17/2016  . Mild malnutrition (HCC) 07/17/2016  . Chronic right SI joint pain 07/17/2016  . Medicare annual wellness visit, subsequent 08/17/2015  . OAB (overactive bladder) 05/03/2015  . Glaucoma 05/03/2015  . Medication management 10/26/2014  . Asthma   . Hypothyroidism   . Hypertension   . GERD (gastroesophageal reflux disease)   . Vitamin D deficiency   . Hyperlipidemia   . DJD (degenerative joint disease)     Screening Tests Immunization History  Administered Date(s)  Administered  . Influenza, High Dose Seasonal PF 08/28/2014, 09/12/2015  . Influenza-Unspecified 08/16/2013  . Pneumococcal Conjugate-13 08/28/2014  . Pneumococcal Polysaccharide-23 07/08/2001  . Tdap 04/07/2013    Preventative care: Last colonoscopy: 2003 declines another Last mammogram: declines Last pap smear/pelvic exam: declines  DEXA: 2009 declines  Prior vaccinations: TD or Tdap: 2014  Influenza: 2016 Pneumococcal: 2002  Prevnar 13 2015 Shingles/Zostavax: declines  Names of Other Physician/Practitioners you currently use: 1. [facility]- here for primary care 2. Dr. Nelle Don, eye doctor, last visit April 2017 q 6 months bifocals/glacoma.  3. Dr. Suzzanne Cloud, dentist, last visit 2016 Patient Care Team: Dr. Bryn Gulling, MD as PCP - General (Internal Medicine)  Allergies Allergies  Allergen Reactions  . Codeine Anaphylaxis    unknown  . Morphine And Related Nausea And Vomiting  . Nsaids     SURGICAL HISTORY She  has a past surgical history  that includes Hip surgery; Thyroid surgery; Esophagogastroduodenoscopy (2009); Nephrectomy (2003); Colonoscopy (2003); Breast biopsy (Right, 1997); Cataract extraction, bilateral (Bilateral); and Excision/release bursa hip (Right, 04/23/2015). FAMILY HISTORY Her family history includes Cancer in her father and mother. SOCIAL HISTORY She  reports that she has never smoked. She does not have any smokeless tobacco history on file. She reports that she does not drink alcohol or use drugs.   MEDICARE WELLNESS OBJECTIVES: Physical activity: Current Exercise Habits: Home exercise routine, Type of exercise: walking, Time (Minutes): 15, Frequency (Times/Week): 3, Weekly Exercise (Minutes/Week): 45, Intensity: Mild Cardiac risk factors: Cardiac Risk Factors include: advanced age (>72men, >21 women);dyslipidemia;hypertension;sedentary lifestyle Depression/mood screen:   Depression screen  [facility] 2/9 07/17/2016  Decreased Interest 0  Down, Depressed, Hopeless 0  PHQ - 2 Score 0    ADLs:  In your present state of health, do you have any difficulty performing the following activities: 07/17/2016  Hearing? N  Vision? N  Difficulty concentrating or making decisions? N  Walking or climbing stairs? Y  Dressing or bathing? N  Doing errands, shopping? Y  Preparing Food and eating ? N  Using the Toilet? Y  In the past six months, have you accidently leaked urine? Y  Do you have problems with loss of bowel control? N  Managing your Medications? N  Managing your Finances? Y  Housekeeping or managing your Housekeeping? Y  Some recent data might be hidden    Cognitive Testing  Alert? Yes  Normal Appearance?Yes  Oriented to person? Yes  Place? Yes   Time? Yes  Recall of three objects?  Yes  Can perform simple calculations? Yes  Displays appropriate judgment?Yes  Can read the correct time from a watch face?Yes  EOL planning: Does patient have an advance directive?: Yes Does patient want to make changes to advanced directive?: No - Patient declined Copy of advanced directive(s) in chart?: No - copy requested   Objective:     Blood pressure 120/66, pulse 61, temperature 97.9 F (36.6 C), resp. rate 14, height  (1.651 m), weight 122 lb 12.8 oz (55.7 kg), SpO2 98 %. Body mass index is 20.43 kg/m.  General appearance: alert, no distress, WD/WN,  female HEENT: normocephalic, sclerae anicteric, TMs pearly, nares patent, no discharge or erythema, pharynx normal Oral cavity: MMM, no lesions Neck: supple, no lymphadenopathy, no thyromegaly, no masses Heart: RRR, normal S1, S2, no murmurs Lungs: CTA bilaterally, no wheezes, rhonchi, or rales Abdomen: +bs, soft, nontender,  non distended, no masses, no hepatomegaly, no splenomegaly Musculoskeletal:  no swelling, no obvious deformity, + tenderness right SI, negative straight leg.  Extremities: no edema, no cyanosis, no  clubbing Pulses: 2+ symmetric, upper and lower extremities, normal cap refill Neurological: alert, oriented x 3, CN2-12 intact, strength normal upper extremities and lower extremities, sensation normal throughout, DTRs 2+ throughout, no cerebellar signs, gait slow with walker Psychiatric: normal affect, behavior normal, pleasant  Breast: defer Gyn: defer   Rectal: defer  Medicare Attestation I have personally reviewed: The patient's medical and social history Their use of alcohol, tobacco or illicit drugs Their current medications and supplements The patient's functional ability including ADLs,fall risks, home safety risks, cognitive, and hearing and visual impairment Diet and physical activities Evidence for depression or mood disorders  The patient's weight, height, BMI, and visual acuity have been recorded in the chart.  I have made referrals, counseling, and provided education to the patient based on review of the above and I have provided the patient with  a written personalized care plan for preventive services.     Quentin MullingAmanda Calle Schader, PA-C   07/17/2016

## 2016-08-27 ENCOUNTER — Encounter (HOSPITAL_COMMUNITY): Payer: Self-pay

## 2016-08-27 ENCOUNTER — Emergency Department (HOSPITAL_COMMUNITY): Payer: Medicare Other

## 2016-08-27 ENCOUNTER — Emergency Department (HOSPITAL_COMMUNITY)
Admission: EM | Admit: 2016-08-27 | Discharge: 2016-08-27 | Disposition: A | Discharge status: Home / Self Care | Payer: Medicare Other | Attending: Emergency Medicine | Admitting: Emergency Medicine

## 2016-08-27 DIAGNOSIS — Y999 Unspecified external cause status: Secondary | ICD-10-CM | POA: Insufficient documentation

## 2016-08-27 DIAGNOSIS — Y9301 Activity, walking, marching and hiking: Secondary | ICD-10-CM | POA: Diagnosis not present

## 2016-08-27 DIAGNOSIS — Z7951 Long term (current) use of inhaled steroids: Secondary | ICD-10-CM | POA: Insufficient documentation

## 2016-08-27 DIAGNOSIS — S42032A Displaced fracture of lateral end of left clavicle, initial encounter for closed fracture: Secondary | ICD-10-CM | POA: Diagnosis not present

## 2016-08-27 DIAGNOSIS — S32501A Unspecified fracture of right pubis, initial encounter for closed fracture: Secondary | ICD-10-CM | POA: Insufficient documentation

## 2016-08-27 DIAGNOSIS — N183 Chronic kidney disease, stage 3 (moderate): Secondary | ICD-10-CM | POA: Diagnosis not present

## 2016-08-27 DIAGNOSIS — S42002A Fracture of unspecified part of left clavicle, initial encounter for closed fracture: Secondary | ICD-10-CM

## 2016-08-27 DIAGNOSIS — I129 Hypertensive chronic kidney disease with stage 1 through stage 4 chronic kidney disease, or unspecified chronic kidney disease: Secondary | ICD-10-CM | POA: Diagnosis not present

## 2016-08-27 DIAGNOSIS — Z79899 Other long term (current) drug therapy: Secondary | ICD-10-CM | POA: Diagnosis not present

## 2016-08-27 DIAGNOSIS — Y9289 Other specified places as the place of occurrence of the external cause: Secondary | ICD-10-CM | POA: Insufficient documentation

## 2016-08-27 DIAGNOSIS — E039 Hypothyroidism, unspecified: Secondary | ICD-10-CM | POA: Diagnosis not present

## 2016-08-27 DIAGNOSIS — S32591A Other specified fracture of right pubis, initial encounter for closed fracture: Secondary | ICD-10-CM

## 2016-08-27 DIAGNOSIS — W0110XA Fall on same level from slipping, tripping and stumbling with subsequent striking against unspecified object, initial encounter: Secondary | ICD-10-CM | POA: Diagnosis not present

## 2016-08-27 DIAGNOSIS — S4992XA Unspecified injury of left shoulder and upper arm, initial encounter: Secondary | ICD-10-CM | POA: Diagnosis present

## 2016-08-27 DIAGNOSIS — J45909 Unspecified asthma, uncomplicated: Secondary | ICD-10-CM | POA: Insufficient documentation

## 2016-08-27 DIAGNOSIS — W19XXXA Unspecified fall, initial encounter: Secondary | ICD-10-CM

## 2016-08-27 DIAGNOSIS — R51 Headache: Secondary | ICD-10-CM | POA: Insufficient documentation

## 2016-08-27 NOTE — ED Notes (Signed)
Her son and daughter share with Dr. Jeraldine LootsLockwood and me that they have a close family member whom has access to pt. Who has an opioid addiction problem, therefore, they request if possible to avoid opioid/narcotic prescription(s) if possible.

## 2016-08-27 NOTE — ED Notes (Signed)
Bed: [facility] Expected date:  Expected time:  Means of arrival:  Comments: EMS-fall 

## 2016-08-27 NOTE — ED Provider Notes (Signed)
[facility]-EMERGENCY DEPT Provider Note   CSN: [id number] Arrival date & time: 08/27/16  1243     History   Chief Complaint Chief Complaint  Patient presents with  . Fall    HPI Kelly MCCLANE is a 82 y.o. female.  Patient is a 82 year old female with history of asthma, hypertension, reflux and renal insufficiency presents to the ED via EMS status post witnessed fall. Patient states she was at [facility] with her granddaughter when she began walking to a table using her cane when she suddenly fell. She notes she tried to catch herself by grabbing on to a shelf near by but notes she fell backwards onto her right side. Endorses hitting her head. Denies LOC. Patient's daughter at bedside reports that granddaughter states she saw the patient's ankle give out on her resulting in her falling. Patient reports having pain to her left shoulder, left wrist, right hip and left ankle. She also endorses having a mild headache. Denies fever, lightheadedness, dizziness, visual changes, difficulty breathing, chest pain, abdominal pain, nausea, vomiting, urinary incontinence, numbness, tingling, weakness. EMS reports the patient was able to stand and sit after the fall. Denies use of anticoagulants. Denies taking any medications prior to arrival.      Past Medical History:  Diagnosis Date  . Asthma   . GERD (gastroesophageal reflux disease)   . Glaucoma   . Hyperlipidemia   . Hypertension   . Renal insufficiency   . Vitamin D deficiency     Patient Active Problem List   Diagnosis Date Noted  . Atherosclerosis of aorta (HCC) 07/17/2016  . CKD (chronic kidney disease) stage 3, GFR 30-59 ml/min 07/17/2016  . Mild malnutrition (HCC) 07/17/2016  . Chronic right SI joint pain 07/17/2016  . Medicare annual wellness visit, subsequent 08/17/2015  . OAB (overactive bladder) 05/03/2015  . Glaucoma 05/03/2015  . Medication management 10/26/2014  . Asthma   . Hypothyroidism   . Hypertension   . GERD  (gastroesophageal reflux disease)   . Vitamin D deficiency   . Hyperlipidemia   . DJD (degenerative joint disease)     Past Surgical History:  Procedure Laterality Date  . BREAST BIOPSY Right 1997  . CATARACT EXTRACTION, BILATERAL Bilateral   . COLONOSCOPY  2003  . ESOPHAGOGASTRODUODENOSCOPY  2009  . EXCISION/RELEASE BURSA HIP Right 04/23/2015   Procedure: RIGHT HIP BURSECTOMY, EXCISION OF HETEROTOPIC BONE ;  Surgeon: Ollen Gross, MD;  Location: [facility];  Service: Orthopedics;  Laterality: Right;  . HIP SURGERY    . NEPHRECTOMY  2003  . THYROID SURGERY      OB History    No data available       Home Medications    Prior to Admission medications   Medication Sig Start Date End Date Taking? Authorizing Provider  acetaminophen (TYLENOL) 650 MG CR tablet Take 1,300 mg by mouth every 4 (four) hours as needed for pain.    Yes Historical Provider, MD  albuterol (PROVENTIL HFA;VENTOLIN HFA) 108 (90 Base) MCG/ACT inhaler Inhale 2 puffs into the lungs every 6 (six) hours as needed. Shortness of breath 12/06/15  Yes Quentin Mulling, PA-C  cholecalciferol (VITAMIN D) 1000 UNITS tablet Take 2,000 Units by mouth every morning.    Yes Historical Provider, MD  fluticasone (FLONASE) 50 MCG/ACT nasal spray Place 2 sprays into both nostrils daily as needed for allergies or rhinitis.    Yes Historical Provider, MD  latanoprost (XALATAN) 0.005 % ophthalmic solution Place 1 drop into the left eye at  bedtime. 03/20/15  Yes Historical Provider, MD  levothyroxine (SYNTHROID, LEVOTHROID) 100 MCG tablet TAKE ONE TABLET BY MOUTH ONCE DAILY Patient taking differently: TAKE TABLET BY MOUTH ONCE DAILY 02/29/16  Yes Lucky Cowboy, MD  ranitidine (ZANTAC) 300 MG tablet Take twice a day while trying to get off PPI, then can go to once at night Patient taking differently: Take 150-300 mg by mouth 2 (two) times daily as needed for heartburn.  02/01/15  Yes Quentin Mulling, PA-C  timolol (TIMOPTIC-XR) 0.5 %  ophthalmic gel-forming Place 1 drop into the left eye every morning. 03/19/15  Yes Historical Provider, MD  pantoprazole (PROTONIX) 40 MG tablet TAKE 1 TABLET EVERY DAY FOR ACID REFLUX Patient not taking: Reported on 08/27/2016 04/18/14   Lucky Cowboy, MD  VESICARE 5 MG tablet TAKE 1 TABLET BY MOUTH EVERY DAY FOR OVER ACTIVE BLADDER Patient not taking: Reported on 08/27/2016 07/18/14   Lucky Cowboy, MD    Family History Family History  Problem Relation Age of Onset  . Cancer Mother     breast  . Cancer Father     prostate    Social History Social History  Substance Use Topics  . Smoking status: Never Smoker  . Smokeless tobacco: Not on file  . Alcohol use No     Allergies   Codeine; Lyrica [pregabalin]; Morphine and related; Nsaids; and Other   Review of Systems Review of Systems  Musculoskeletal: Positive for arthralgias (left shoulder, left wrist, left ankle, right hip).  Neurological: Positive for headaches (mild).  All other systems reviewed and are negative.    Physical Exam Updated Vital Signs BP 167/92 (BP Location: Right Arm)   Pulse (!) 57   Temp 97.9 F (36.6 C) (Oral)   Resp 16   SpO2 100%   Physical Exam  Constitutional: She is oriented to person, place, and time. She appears well-developed and well-nourished. No distress.  HENT:  Head: Normocephalic. Head is without raccoon's eyes, without Battle's sign, without abrasion and without laceration.  Right Ear: Tympanic membrane normal. No hemotympanum.  Left Ear: Tympanic membrane normal. No hemotympanum.  Nose: Nose normal. No sinus tenderness, nasal deformity, septal deviation or nasal septal hematoma. No epistaxis.  Mouth/Throat: Uvula is midline, oropharynx is clear and moist and mucous membranes are normal. No oropharyngeal exudate, posterior oropharyngeal edema, posterior oropharyngeal erythema or tonsillar abscesses.  Eyes: Conjunctivae and EOM are normal. Pupils are equal, round, and reactive to  light. Right eye exhibits no discharge. Left eye exhibits no discharge. No scleral icterus.  Neck: Normal range of motion. Neck supple.  Cardiovascular: Normal rate, regular rhythm, normal heart sounds and intact distal pulses.   Pulmonary/Chest: Effort normal and breath sounds normal. No respiratory distress. She has no wheezes. She has no rales. She exhibits no tenderness.  Abdominal: Soft. Bowel sounds are normal. She exhibits no distension and no mass. There is no tenderness. There is no rebound and no guarding. No hernia.  Musculoskeletal: Normal range of motion. She exhibits tenderness. She exhibits no edema or deformity.  No  thoracic or lumbar spine midline TTP. Midline cervical tenderness. Full ROM of bilateral upper and lower extremities with 5/5 strength.  Decreased ROM of left shoulder due to reported pain. TTP over left shoulder and distal clavicle. TTP over left wrist and distal forearm. TTP over left ankle. TTP over right hip. FROM of bilateral hips, reported pain with external rotation of right hip. No pelvic instability. No leg shortening or malrotation noted. 2+ radial and  PT pulses. Sensation grossly intact.   Neurological: She is alert and oriented to person, place, and time. She has normal strength. No cranial nerve deficit or sensory deficit. Coordination and gait normal.  Skin: Skin is warm and dry. Capillary refill takes less than 2 seconds. She is not diaphoretic.  Nursing note and vitals reviewed.    ED Treatments / Results  Labs (all labs ordered are listed, but only abnormal results are displayed) Labs Reviewed - No data to display  EKG  EKG Interpretation None       Radiology Dg Forearm Left  Result Date: 08/27/2016 CLINICAL DATA:  Tripped and fell, left forearm pain EXAM: LEFT FOREARM - 2 VIEW COMPARISON:  None. FINDINGS: There is degenerative change of the left radiocarpal joint space with loss of joint space and sclerosis with spurring. The bones are  diffusely osteopenic. However no acute fracture is seen. No elbow joint effusion is seen. IMPRESSION: Osteopenia and degenerative change.  No acute fracture. Electronically Signed   By: Dwyane Dee M.D.   On: 08/27/2016 14:59   Dg Wrist Complete Left  Result Date: 08/27/2016 CLINICAL DATA:  Tripped and fell at coffee shop.  Wrist pain. EXAM: LEFT WRIST - COMPLETE 3+ VIEW COMPARISON:  None. FINDINGS: Chronic osteoarthritis of the radiocarpal joint with loss of joint space. Widening of the distance between the scaphoid and lunate consistent with chronic ligament tear. No evidence of acute fracture or carpal dislocation. IMPRESSION: No acute finding. Chronic osteoarthritis of the radiocarpal joint. Chronic scapholunate ligament tear. Electronically Signed   By: Paulina Fusi M.D.   On: 08/27/2016 15:00   Dg Ankle Complete Left  Result Date: 08/27/2016 CLINICAL DATA:  Tripped and fell today with pain EXAM: LEFT ANKLE COMPLETE - 3+ VIEW COMPARISON:  None. FINDINGS: No acute fracture is seen. The bones are osteopenic. The ankle joint is unremarkable and alignment is normal IMPRESSION: Negative. Electronically Signed   By: Dwyane Dee M.D.   On: 08/27/2016 15:00   Ct Head Wo Contrast  Result Date: 08/27/2016 CLINICAL DATA:  82 year old female status post trip and fall. Headaches. Initial encounter. EXAM: CT HEAD WITHOUT CONTRAST CT CERVICAL SPINE WITHOUT CONTRAST TECHNIQUE: Multidetector CT imaging of the head and cervical spine was performed following the standard protocol without intravenous contrast. Multiplanar CT image reconstructions of the cervical spine were also generated. COMPARISON:  Head and cervical spine CT 04/12/2011. FINDINGS: CT HEAD FINDINGS Visualized paranasal sinuses and mastoids are stable and well pneumatized. No scalp hematoma identified. Stable orbits soft tissues. Calvarium appears stable and intact. Calcified atherosclerosis at the skull base. Cerebral volume remains normal for age. No  midline shift, ventriculomegaly, mass effect, evidence of mass lesion, intracranial hemorrhage or evidence of cortically based acute infarction. Gray-white matter differentiation is within normal limits throughout the brain. CT CERVICAL SPINE FINDINGS Chronic straightening of cervical lordosis. Stable mild anterolisthesis from the C2 to the C6 level. Degenerative ligamentous hypertrophy about the odontoid with mild chronic posterior odontoid erosion which has not significantly changed. Progressed and now severe disc space loss at C3-C4. Chronic disc and endplate degeneration at C6-C7. Visualized skull base is intact. No atlanto-occipital dissociation. Bilateral posterior element alignment is within normal limits. Interval right C3-C4 facet ankylosis. Widespread cervical facet arthropathy and spurring. Cervicothoracic junction alignment is within normal limits. No cervical spine fracture identified. Grossly intact visualized upper thoracic levels. Calcified carotid atherosclerosis with otherwise negative noncontrast neck soft tissues. IMPRESSION: 1. Stable and normal for age non contrast CT appearance of  the brain. 2. No acute fracture or listhesis identified in the cervical spine. Ligamentous injury is not excluded. 3. Advanced cervical spine degeneration with progression at C3-C4 since 2012. Electronically Signed   By: Odessa FlemingH  Hall M.D.   On: 08/27/2016 14:44   Ct Cervical Spine Wo Contrast  Result Date: 08/27/2016 CLINICAL DATA:  82 year old female status post trip and fall. Headaches. Initial encounter. EXAM: CT HEAD WITHOUT CONTRAST CT CERVICAL SPINE WITHOUT CONTRAST TECHNIQUE: Multidetector CT imaging of the head and cervical spine was performed following the standard protocol without intravenous contrast. Multiplanar CT image reconstructions of the cervical spine were also generated. COMPARISON:  Head and cervical spine CT 04/12/2011. FINDINGS: CT HEAD FINDINGS Visualized paranasal sinuses and mastoids are  stable and well pneumatized. No scalp hematoma identified. Stable orbits soft tissues. Calvarium appears stable and intact. Calcified atherosclerosis at the skull base. Cerebral volume remains normal for age. No midline shift, ventriculomegaly, mass effect, evidence of mass lesion, intracranial hemorrhage or evidence of cortically based acute infarction. Gray-white matter differentiation is within normal limits throughout the brain. CT CERVICAL SPINE FINDINGS Chronic straightening of cervical lordosis. Stable mild anterolisthesis from the C2 to the C6 level. Degenerative ligamentous hypertrophy about the odontoid with mild chronic posterior odontoid erosion which has not significantly changed. Progressed and now severe disc space loss at C3-C4. Chronic disc and endplate degeneration at C6-C7. Visualized skull base is intact. No atlanto-occipital dissociation. Bilateral posterior element alignment is within normal limits. Interval right C3-C4 facet ankylosis. Widespread cervical facet arthropathy and spurring. Cervicothoracic junction alignment is within normal limits. No cervical spine fracture identified. Grossly intact visualized upper thoracic levels. Calcified carotid atherosclerosis with otherwise negative noncontrast neck soft tissues. IMPRESSION: 1. Stable and normal for age non contrast CT appearance of the brain. 2. No acute fracture or listhesis identified in the cervical spine. Ligamentous injury is not excluded. 3. Advanced cervical spine degeneration with progression at C3-C4 since 2012. Electronically Signed   By: Odessa FlemingH  Hall M.D.   On: 08/27/2016 14:44   Dg Shoulder Left  Result Date: 08/27/2016 CLINICAL DATA:  Tripped and fell at coffee shop. Left shoulder pain. EXAM: LEFT SHOULDER - 2+ VIEW COMPARISON:  None. FINDINGS: Probable distal clavicle fracture, nondisplaced. No other acute finding. IMPRESSION: Probable distal clavicle fracture. Electronically Signed   By: Paulina FusiMark  Shogry M.D.   On: 08/27/2016  14:59   Dg Hip Unilat W Or Wo Pelvis 2-3 Views Right  Result Date: 08/27/2016 CLINICAL DATA:  Tripped and fell today with pain EXAM: DG HIP (WITH OR WITHOUT PELVIS) 2-3V RIGHT COMPARISON:  None. FINDINGS: The bones are diffusely osteopenic. Right total hip replacement is noted. No complicating features are seen. On the for oblique view there is a linear lucency through the inferior right pelvic ramus which could represent a tiny nondisplaced fracture fragment. The pelvic rami are otherwise intact. The SI joints appear corticated. There are degenerative changes present in the lower lumbar spine. IMPRESSION: Question of tiny nondisplaced fracture fragment from the inferior right vascular ramus. Right total hip replacement components in good position. Electronically Signed   By: Dwyane DeePaul  Barry M.D.   On: 08/27/2016 15:02    Procedures Procedures (including critical care time)  Medications Ordered in ED Medications - No data to display   Initial Impression / Assessment and Plan / ED Course  I have reviewed the triage vital signs and the nursing notes.  Pertinent labs & imaging results that were available during my care of the patient were reviewed  by me and considered in my medical decision making (see chart for details).  Clinical Course    Patient presents status post mechanical fall. Endorses head injury, denies LOC. Endorses pain to left shoulder, left wrist, right hip and left ankle. Patient is at baseline mental status. Denies use of anticoagulants. VSS. Exam revealed tenderness over left shoulder, left wrist, right hip and left ankle, midline cervical tenderness. No evidence of head injury. No neuro deficits. Patient declined any pain meds at this time. Left shoulder x-ray revealed distal clavicle fracture. Right hip and pelvis x-ray revealed question of tiny nondisplaced fracture fragment of the inferior right vascular ramus, right total hip replacement in good position. Remaining x-rays and  CT head/neck unremarkable. Discussed results with patient and family. Patient given sling immobilizer and advised to use for comfort. Plan to discharge patient home with symptomatic treatment including Tylenol and cryotherapy. Advised patient to follow up with PCP within the next week. Discussed return precautions.  Final Clinical Impressions(s) / ED Diagnoses   Final diagnoses:  Fall, initial encounter  Clavicle fracture, left, closed, initial encounter  Inferior pubic ramus fracture, right, closed, initial encounter [facility])    New Prescriptions New Prescriptions   No medications on file     Barrett Henle, [place] 08/27/16 1536    Gerhard Munch, MD 08/28/16 1204

## 2016-08-27 NOTE — ED Notes (Signed)
She ambulates with assist of her (usual) cane without difficulty.

## 2016-08-27 NOTE — Progress Notes (Addendum)
Per pt's pcp OV notes she walks with a walker at baseline Pending completion of evaluation by EDP for any other possible needs

## 2016-08-27 NOTE — ED Notes (Signed)
Patient was alert, oriented and stable upon discharge. RN went over AVS and patient had no further questions.  

## 2016-08-27 NOTE — Discharge Instructions (Signed)
I recommend taking Tylenol as prescribed over the counter as needed for pain relief. I also recommend applying ice for 15 minutes 3-4 times daily to help with pain and swelling. Wear your arm sling as needed for comfort but refrain from using it all day everyday to prevent frozen shoulder.  Follow up with your primary care provider in the week. Please return to the Emergency Department if symptoms worsen or new onset of fever, headache, confusion, altered mental status, chest pain, shortness of breath, abdominal pain, numbness, tingling, weakness.

## 2016-08-27 NOTE — ED Notes (Signed)
She was with her daughter at a local coffee shop, when she tripped and fell. She c/o bilat. Shoulder and right hip pain. She states "My right hip hurts all the time-it's the one that's been replaced". EMS state pt. Was able to stand and thence sit after this fall after having been assisted up. She is alert and oriented and her daughter is with her.

## 2016-09-09 ENCOUNTER — Ambulatory Visit (INDEPENDENT_AMBULATORY_CARE_PROVIDER_SITE_OTHER): Payer: Medicare Other | Admitting: *Deleted

## 2016-09-09 DIAGNOSIS — Z23 Encounter for immunization: Secondary | ICD-10-CM

## 2016-09-23 ENCOUNTER — Other Ambulatory Visit: Payer: Self-pay

## 2016-09-23 MED ORDER — LEVOTHYROXINE SODIUM 100 MCG PO TABS: 100.0000 ug | ORAL_TABLET | Freq: Every day | ORAL | 1 refills | Status: AC

## 2016-10-13 ENCOUNTER — Other Ambulatory Visit: Payer: Self-pay | Admitting: Internal Medicine

## 2017-01-20 ENCOUNTER — Ambulatory Visit: Payer: Self-pay | Admitting: Internal Medicine

## 2017-08-08 DEATH — deceased
